# Patient Record
Sex: Female | Born: 2000 | Race: White | Hispanic: No | Marital: Single | Smoking: Never smoker
Health system: Southern US, Community
[De-identification: ages and names within clinical notes are randomized; demographics above are authoritative.]

## PROBLEM LIST (undated history)

## (undated) DIAGNOSIS — J302 Other seasonal allergic rhinitis: Secondary | ICD-10-CM

## (undated) HISTORY — PX: NO PAST SURGERIES: SHX2092

## (undated) HISTORY — DX: Other seasonal allergic rhinitis: J30.2

---
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
## 2019-02-17 ENCOUNTER — Other Ambulatory Visit: Payer: Self-pay

## 2019-02-17 ENCOUNTER — Encounter: Payer: Self-pay | Admitting: Internal Medicine

## 2019-02-17 ENCOUNTER — Telehealth (INDEPENDENT_AMBULATORY_CARE_PROVIDER_SITE_OTHER): Payer: BC Managed Care – PPO | Admitting: Internal Medicine

## 2019-02-17 DIAGNOSIS — M549 Dorsalgia, unspecified: Secondary | ICD-10-CM | POA: Diagnosis not present

## 2019-02-17 DIAGNOSIS — G8929 Other chronic pain: Secondary | ICD-10-CM

## 2019-02-17 NOTE — Progress Notes (Signed)
Virtual Visit via Video Note  I connected with@ on 02/17/19 at  2:00 PM EST by a video enabled telemedicine application and verified that I am speaking with the correct person using two identifiers. Location patient: home Location provider: home office Persons participating in the virtual visit: patient, provider  [place] national recommendations  regarding COVID 19 pandemic   video visit is advised over in office visit for this patient.  Patient aware  of the limitations of evaluation and management by telemedicine and  availability of in person appointments. and agreed to proceed.   HPI: Morgan Snyder presents for video visit   She has had problems with low  back pain and side pain for quite a while over a year but over the last 6 months has been more problematic.  No UTI symptoms but would be checked for that.  She states it bothers her at night sometimes has to put a pillow around her back support to relieve no radiation no weakness no bowel or bladder loss. Denies any specific injury but this has been going ongoing and her mother was concerned.  Has never had an x-ray of her back. No fevers abdominal pain States she is on OCPs.  Due this week.  College Student   On break   ROS: See pertinent positives and negatives per HPI. No fevers hematuria  Past Medical History:  Diagnosis Date  . Seasonal allergies     Past Surgical History:  Procedure Laterality Date  . NO PAST SURGERIES      Family History  Problem Relation Age of Onset  . Anemia Mother   . Hypertension Unknown   . Deafness Unknown       Current Outpatient Medications:  .  erythromycin ophthalmic ointment, Place 1 application into the left eye at bedtime. For 5-7 days., Disp: 3.5 g, Rfl: 0 .  levonorgestrel-ethinyl estradiol (PORTIA-28) 0.15-30 MG-MCG tablet, Take 1 tablet by mouth daily., Disp: 84 tablet, Rfl: 3  EXAM: BP Readings from Last 3 Encounters:  04/04/18 106/68  03/13/18 102/60  11/05/17  99/60    VITALS per patient if applicable:  GENERAL: alert, oriented, appears well and in no acute distress  HEENT: atraumatic, conjunttiva clear, no obvious abnormalities on inspection of external nose and ears  NECK: normal movements of the head and neck  LUNGS: on inspection no signs of respiratory distress, breathing rate appears normal, no obvious gross SOB, gasping or wheezing  CV: no obvious cyanosis  MS: moves all visible extremities without noticeable abnormality  PSYCH/NEURO: pleasant and cooperative, no obvious depression or anxiety, speech and thought processing grossly intact   ASSESSMENT AND PLAN:  Discussed the following assessment and plan:    ICD-10-CM   1. Chronic back pain greater than 3 months duration  M54.9 Urinalysis, Routine w reflex microscopic   G89.29 DG Lumbar Spine Complete    CBC with Differential    CMP    Sed Rate (ESR)    C-reactive protein    DG Si Joints    Pregnancy, urine   Ongoing back pain that is worse in a young person without obvious cause.  Consider inflammatory other mechanical causes. Check lab work to include inflammatory markers x-ray for back and SI joints urine screen and pregnancy Depending on results we will make next step physical therapy and/or sports medicine or back specialist.  She has used turmeric in the past with some slight improvement.  Counseled.   Expectant management and discussion of plan and treatment  with opportunity to ask questions and all were answered. The patient agreed with the plan and demonstrated an understanding of the instructions.   Advised to call back or seek an in-person evaluation if worsening  or having  further concerns . Return if symptoms worsen or fail to improve, for depending on results of evaluation.   Shanon Ace, MD

## 2019-02-19 ENCOUNTER — Other Ambulatory Visit: Payer: Self-pay

## 2019-02-19 ENCOUNTER — Ambulatory Visit (INDEPENDENT_AMBULATORY_CARE_PROVIDER_SITE_OTHER)
Admission: RE | Admit: 2019-02-19 | Discharge: 2019-02-19 | Disposition: A | Discharge status: Home / Self Care | Payer: BC Managed Care – PPO | Source: Ambulatory Visit | Attending: Internal Medicine | Admitting: Internal Medicine

## 2019-02-19 DIAGNOSIS — M533 Sacrococcygeal disorders, not elsewhere classified: Secondary | ICD-10-CM | POA: Diagnosis not present

## 2019-02-19 DIAGNOSIS — G8929 Other chronic pain: Secondary | ICD-10-CM | POA: Diagnosis not present

## 2019-02-19 DIAGNOSIS — M549 Dorsalgia, unspecified: Secondary | ICD-10-CM | POA: Diagnosis not present

## 2019-02-19 DIAGNOSIS — M545 Low back pain: Secondary | ICD-10-CM | POA: Diagnosis not present

## 2019-02-24 NOTE — Progress Notes (Signed)
No acute findings on x ray of back or SI joints  to explain back pain . She did not get the blood work?   Please advise can she still get this done?  See orders  for ? [facility]?

## 2019-02-24 NOTE — Progress Notes (Signed)
No acute findings on x ray of back or SI joints  to explain back pain . She did not get the blood work?   Please advise can she still get this done?  See orders  for ? [facility]?

## 2019-06-17 ENCOUNTER — Encounter: Payer: Self-pay | Admitting: Internal Medicine

## 2019-06-17 ENCOUNTER — Other Ambulatory Visit: Payer: Self-pay

## 2019-06-17 ENCOUNTER — Other Ambulatory Visit (INDEPENDENT_AMBULATORY_CARE_PROVIDER_SITE_OTHER): Payer: Self-pay

## 2019-06-17 ENCOUNTER — Telehealth (INDEPENDENT_AMBULATORY_CARE_PROVIDER_SITE_OTHER): Payer: Self-pay | Admitting: Internal Medicine

## 2019-06-17 VITALS — Ht 64.0 in | Wt 120.0 lb

## 2019-06-17 DIAGNOSIS — R3989 Other symptoms and signs involving the genitourinary system: Secondary | ICD-10-CM

## 2019-06-17 DIAGNOSIS — N926 Irregular menstruation, unspecified: Secondary | ICD-10-CM

## 2019-06-17 DIAGNOSIS — Z3041 Encounter for surveillance of contraceptive pills: Secondary | ICD-10-CM

## 2019-06-17 DIAGNOSIS — G8929 Other chronic pain: Secondary | ICD-10-CM

## 2019-06-17 LAB — URINALYSIS, ROUTINE W REFLEX MICROSCOPIC
Bilirubin Urine: NEGATIVE
Ketones, ur: NEGATIVE
Nitrite: NEGATIVE
Specific Gravity, Urine: 1.01 (ref 1.000–1.030)
Total Protein, Urine: NEGATIVE
Urine Glucose: NEGATIVE
Urobilinogen, UA: 0.2 (ref 0.0–1.0)
pH: 6 (ref 5.0–8.0)

## 2019-06-17 MED ORDER — SULFAMETHOXAZOLE-TRIMETHOPRIM 800-160 MG PO TABS: 1.0000 | ORAL_TABLET | Freq: Two times a day (BID) | ORAL | 0 refills | Status: DC

## 2019-06-17 NOTE — Progress Notes (Signed)
Virtual Visit via Video Note  I connected with@ on 06/17/19 at  2:00 PM EDT by a video enabled telemedicine application and verified that I am speaking with the correct person using two identifiers. Location patient: home Location provider:work  office Persons participating in the virtual visit: patient, provider  [place] national recommendations  regarding COVID 19 pandemic   video visit is advised over in office visit for this patient.  Patient aware  of the limitations of evaluation and management by telemedicine and  availability of in person appointments. and agreed to proceed.   HPI: Morgan Snyder presents for video visit onset last pm some  stinging on urination and then frequency    Then hematuria    And frequency   Took ibuprofen  No abd pain per se v d rash On new ocps since jan had some irreg bleeding but no ewd bleeding last week  Did  UPT and was negative so continues   1 partner no dc or sti sx   Over she says feels like uti a bit better today but  On going.   ROS: See pertinent positives and negatives per HPI.  Past Medical History:  Diagnosis Date  . Seasonal allergies     Past Surgical History:  Procedure Laterality Date  . NO PAST SURGERIES      Family History  Problem Relation Age of Onset  . Anemia Mother   . Hypertension Other   . Deafness Other     Social History   Tobacco Use  . Smoking status: Passive Smoke Exposure - Never Smoker  . Smokeless tobacco: Never Used  Substance Use Topics  . Alcohol use: Yes    Alcohol/week: 1.0 standard drinks    Types: 1 Glasses of wine per week    Comment: Occassionally  . Drug use: Not Currently    Types: Marijuana      Current Outpatient Medications:  .  AUROVELA FE 1/20 1-20 MG-MCG tablet, Take 1 tablet by mouth daily., Disp: , Rfl:  .  Cyanocobalamin (B-12) 2500 MCG SUBL, Place 1 tablet under the tongue daily., Disp: , Rfl:  .  ferrous sulfate 325 (65 FE) MG tablet, Take 325 mg by mouth  daily with breakfast., Disp: , Rfl:  .  Multiple Vitamin (MULTIVITAMIN ADULT PO), multivitamin, Disp: , Rfl:  .  TURMERIC PO, Take 1 tablet by mouth daily., Disp: , Rfl:  .  erythromycin ophthalmic ointment, Place 1 application into the left eye at bedtime. For 5-7 days. (Patient not taking: Reported on 06/17/2019), Disp: 3.5 g, Rfl: 0 .  levonorgestrel-ethinyl estradiol (PORTIA-28) 0.15-30 MG-MCG tablet, Take 1 tablet by mouth daily. (Patient not taking: Reported on 06/17/2019), Disp: 84 tablet, Rfl: 3 .  sulfamethoxazole-trimethoprim (BACTRIM DS) 800-160 MG tablet, Take 1 tablet by mouth 2 (two) times daily. For uti, Disp: 10 tablet, Rfl: 0  EXAM: BP Readings from Last 3 Encounters:  04/04/18 106/68  03/13/18 102/60  11/05/17 99/60    VITALS per patient if applicable:  GENERAL: alert, oriented, appears well and in no acute distress  HEENT: atraumatic, conjunttiva clear, no obvious abnormalities on inspection of external nose and ears NECK: normal movements of the head and neck  LUNGS: on inspection no signs of respiratory distress, breathing rate appears normal, no obvious gross SOB, gasping or wheezing CV: no obvious cyanosis MS: moves all visible extremities without noticeable abnormality PSYCH/NEURO: pleasant and cooperative, no obvious depression or anxiety, speech and thought processing grossly intact Lab Results  Component Value Date   WBC 6.5 09/01/2015   HGB 12.8 11/05/2017   HCT 37.3 09/01/2015   PLT 302.0 09/01/2015   GLUCOSE 86 11/25/2014   CHOL 115 11/25/2014   TRIG 69.0 11/25/2014   HDL 47.10 11/25/2014   LDLCALC 54 11/25/2014   ALT 11 11/25/2014   AST 14 11/25/2014   NA 142 11/25/2014   K 4.2 11/25/2014   CL 106 11/25/2014   CREATININE 0.63 11/25/2014   BUN 7 11/25/2014   CO2 28 11/25/2014   TSH 1.21 11/25/2014    ASSESSMENT AND PLAN:  Discussed the following assessment and plan:    ICD-10-CM   1. Suspected UTI  R39.89 Urinalysis, Routine w reflex  microscopic    Urine Culture    Pregnancy, urine    CANCELED: POCT urine pregnancy  2. Missed period  N92.6 Urinalysis, Routine w reflex microscopic    Urine Culture    Pregnancy, urine    CANCELED: POCT urine pregnancy  3. Oral contraceptive use  Z30.41    Suspected uti   sti less likely based on sx complex  Get ua and ucx and send in medication  Presuming uti  Suspect  Lack of wd bleeding from  Low dose ocps but will check  ucg  Again her HPT was neg .   Counseled.   Expectant management and discussion of plan and treatment with opportunity to ask questions and all were answered. The patient agreed with the plan and demonstrated an understanding of the instructions.   Advised to call back or seek an in-person evaluation if worsening  or having  further concerns .  No follow-ups on file.    Shanon Ace, MD

## 2019-06-17 NOTE — Progress Notes (Signed)
Urine prelim looks mildly abnormal  awaiting culture. Take antibiotic See if you can help her get on  a My chart account.

## 2019-06-19 LAB — URINE CULTURE
MICRO NUMBER:: 10410715
SPECIMEN QUALITY:: ADEQUATE

## 2019-06-19 LAB — PREGNANCY, URINE: Preg Test, Ur: NEGATIVE

## 2019-06-20 NOTE — Progress Notes (Signed)
Tell patient that urine culture shows e coli  typical uti bacteria  sensitive to medication given . Should resolve with current treatment .FU if not better.

## 2019-11-28 ENCOUNTER — Ambulatory Visit: Payer: Self-pay | Admitting: Internal Medicine

## 2019-11-28 ENCOUNTER — Ambulatory Visit: Payer: Self-pay | Admitting: Family Medicine

## 2019-11-28 NOTE — Progress Notes (Deleted)
No chief complaint on file.   HPI: Morgan Snyder 19 y.o. come in for new problem  Saw her GYNE 2 21   Had e coli uti in APril   Gyn is  ROS: See pertinent positives and negatives per HPI.  Past Medical History:  Diagnosis Date  . Seasonal allergies     Family History  Problem Relation Age of Onset  . Anemia Mother   . Hypertension Other   . Deafness Other     Social History   Socioeconomic History  . Marital status: Single    Spouse name: Not on file  . Number of children: Not on file  . Years of education: Not on file  . Highest education level: Not on file  Occupational History  . Occupation: Consulting civil engineer  Tobacco Use  . Smoking status: Passive Smoke Exposure - Never Smoker  . Smokeless tobacco: Never Used  Vaping Use  . Vaping Use: Some days  Substance and Sexual Activity  . Alcohol use: Yes    Alcohol/week: 1.0 standard drink    Types: 1 Glasses of wine per week    Comment: Occassionally  . Drug use: Not Currently    Types: Marijuana  . Sexual activity: Yes    Birth control/protection: Pill  Other Topics Concern  . Not on file  Social History Narrative   In 10 grade at [employer]   Lives with mom Morgan Snyder and step dad    Mom office manager father construction   Volleyball   Household 3 negative pets firearms positive ETS stepfather.    Social Determinants of Health   Financial Resource Strain:   . Difficulty of Paying Living Expenses: Not on file  Food Insecurity:   . Worried About Programme researcher, broadcasting/film/video in the Last Year: Not on file  . Ran Out of Food in the Last Year: Not on file  Transportation Needs:   . Lack of Transportation (Medical): Not on file  . Lack of Transportation (Non-Medical): Not on file  Physical Activity:   . Days of Exercise per Week: Not on file  . Minutes of Exercise per Session: Not on file  Stress:   . Feeling of Stress : Not on file  Social Connections:   . Frequency of Communication with Friends and  Family: Not on file  . Frequency of Social Gatherings with Friends and Family: Not on file  . Attends Religious Services: Not on file  . Active Member of Clubs or Organizations: Not on file  . Attends Banker Meetings: Not on file  . Marital Status: Not on file    Outpatient Medications Prior to Visit  Medication Sig Dispense Refill  . AUROVELA FE 1/20 1-20 MG-MCG tablet Take 1 tablet by mouth daily.    . Cyanocobalamin (B-12) 2500 MCG SUBL Place 1 tablet under the tongue daily.    Marland Kitchen erythromycin ophthalmic ointment Place 1 application into the left eye at bedtime. For 5-7 days. (Patient not taking: Reported on 06/17/2019) 3.5 g 0  . ferrous sulfate 325 (65 FE) MG tablet Take 325 mg by mouth daily with breakfast.    . levonorgestrel-ethinyl estradiol (PORTIA-28) 0.15-30 MG-MCG tablet Take 1 tablet by mouth daily. (Patient not taking: Reported on 06/17/2019) 84 tablet 3  . Multiple Vitamin (MULTIVITAMIN ADULT PO) multivitamin    . sulfamethoxazole-trimethoprim (BACTRIM DS) 800-160 MG tablet Take 1 tablet by mouth 2 (two) times daily. For uti 10 tablet 0  . TURMERIC PO Take  1 tablet by mouth daily.     No facility-administered medications prior to visit.     EXAM:  There were no vitals taken for this visit.  There is no height or weight on file to calculate BMI.  GENERAL: vitals reviewed and listed above, alert, oriented, appears well hydrated and in no acute distress HEENT: atraumatic, conjunctiva  clear, no obvious abnormalities on inspection of external nose and ears OP : no lesion edema or exudate  NECK: no obvious masses on inspection palpation  LUNGS: clear to auscultation bilaterally, no wheezes, rales or rhonchi, good air movement CV: HRRR, no clubbing cyanosis or  peripheral edema nl cap refill  MS: moves all extremities without noticeable focal  abnormality PSYCH: pleasant and cooperative, no obvious depression or anxiety Lab Results  Component Value Date    WBC 6.5 09/01/2015   HGB 12.8 11/05/2017   HCT 37.3 09/01/2015   PLT 302.0 09/01/2015   GLUCOSE 86 11/25/2014   CHOL 115 11/25/2014   TRIG 69.0 11/25/2014   HDL 47.10 11/25/2014   LDLCALC 54 11/25/2014   ALT 11 11/25/2014   AST 14 11/25/2014   NA 142 11/25/2014   K 4.2 11/25/2014   CL 106 11/25/2014   CREATININE 0.63 11/25/2014   BUN 7 11/25/2014   CO2 28 11/25/2014   TSH 1.21 11/25/2014   BP Readings from Last 3 Encounters:  04/04/18 106/68  03/13/18 102/60  11/05/17 99/60    ASSESSMENT AND PLAN:  Discussed the following assessment and plan:  No diagnosis found.  -Patient advised to return or notify health care team  if  new concerns arise.  There are no Patient Instructions on file for this visit.   Neta Mends. Greenleigh Kauth M.D.

## 2019-12-03 ENCOUNTER — Ambulatory Visit (INDEPENDENT_AMBULATORY_CARE_PROVIDER_SITE_OTHER): Payer: PRIVATE HEALTH INSURANCE | Admitting: Internal Medicine

## 2019-12-03 ENCOUNTER — Encounter: Payer: Self-pay | Admitting: Internal Medicine

## 2019-12-03 ENCOUNTER — Other Ambulatory Visit: Payer: Self-pay

## 2019-12-03 ENCOUNTER — Other Ambulatory Visit (HOSPITAL_COMMUNITY)
Admission: RE | Admit: 2019-12-03 | Discharge: 2019-12-03 | Disposition: A | Discharge status: Home / Self Care | Payer: PRIVATE HEALTH INSURANCE | Source: Ambulatory Visit | Attending: Internal Medicine | Admitting: Internal Medicine

## 2019-12-03 VITALS — BP 90/60 | HR 72 | Temp 98.1°F | Ht 65.0 in | Wt 114.0 lb

## 2019-12-03 DIAGNOSIS — N898 Other specified noninflammatory disorders of vagina: Secondary | ICD-10-CM

## 2019-12-03 DIAGNOSIS — Z3009 Encounter for other general counseling and advice on contraception: Secondary | ICD-10-CM | POA: Diagnosis not present

## 2019-12-03 MED ORDER — FLUCONAZOLE 150 MG PO TABS
150.0000 mg | ORAL_TABLET | Freq: Once | ORAL | 0 refills | Status: AC
Start: 2019-12-03 — End: 2019-12-03

## 2019-12-03 NOTE — Progress Notes (Signed)
Chief Complaint  Patient presents with  . Acute Visit    vaginal itching/ raw spot on vaginal area    HPI: Morgan Snyder 19 y.o. come in for  Problem   Has gyne   On ocps but stopped   Recently  About  3 mos ago. Cause  Of   [place].  Has partner  ocass condoms and spermicide  to start   No rx  Wonder sif yeast irchy  Mild and  sore times sore at  Pos introitus  Reluctant to try otc vaginal  As in past   Had uti April e coli  LMP  October 7  No different.   No self rx.  At this  time  Not sure it is yeast but asks for eval    ROS: See pertinent positives and negatives per HPI.  Past Medical History:  Diagnosis Date  . Seasonal allergies     Family History  Problem Relation Age of Onset  . Anemia Mother   . Hypertension Other   . Deafness Other     Social History   Socioeconomic History  . Marital status: Single    Spouse name: Not on file  . Number of children: Not on file  . Years of education: Not on file  . Highest education level: Not on file  Occupational History  . Occupation: Consulting civil engineertudent  Tobacco Use  . Smoking status: Passive Smoke Exposure - Never Smoker  . Smokeless tobacco: Never Used  Vaping Use  . Vaping Use: Some days  Substance and Sexual Activity  . Alcohol use: Yes    Alcohol/week: 1.0 standard drink    Types: 1 Glasses of wine per week    Comment: Occassionally  . Drug use: Not Currently    Types: Marijuana  . Sexual activity: Yes    Birth control/protection: Pill  Other Topics Concern  . Not on file  Social History Narrative   In 10 grade at [employer]   Lives with mom Sherley Boundslisha Friley and step dad    Mom office manager father construction   Volleyball   Household 3 negative pets firearms positive ETS stepfather.    Social Determinants of Health   Financial Resource Strain:   . Difficulty of Paying Living Expenses: Not on file  Food Insecurity:   . Worried About Programme researcher, broadcasting/film/videounning Out of Food in the Last Year: Not on file  . Ran Out of  Food in the Last Year: Not on file  Transportation Needs:   . Lack of Transportation (Medical): Not on file  . Lack of Transportation (Non-Medical): Not on file  Physical Activity:   . Days of Exercise per Week: Not on file  . Minutes of Exercise per Session: Not on file  Stress:   . Feeling of Stress : Not on file  Social Connections:   . Frequency of Communication with Friends and Family: Not on file  . Frequency of Social Gatherings with Friends and Family: Not on file  . Attends Religious Services: Not on file  . Active Member of Clubs or Organizations: Not on file  . Attends BankerClub or Organization Meetings: Not on file  . Marital Status: Not on file    Outpatient Medications Prior to Visit  Medication Sig Dispense Refill  . AUROVELA FE 1/20 1-20 MG-MCG tablet Take 1 tablet by mouth daily.    . Cyanocobalamin (B-12) 2500 MCG SUBL Place 1 tablet under the tongue daily.    Marland Kitchen. erythromycin ophthalmic  ointment Place 1 application into the left eye at bedtime. For 5-7 days. 3.5 g 0  . ferrous sulfate 325 (65 FE) MG tablet Take 325 mg by mouth daily with breakfast.    . Multiple Vitamin (MULTIVITAMIN ADULT PO) multivitamin    . PHEXXI 1.8-1-0.4 % GEL Place vaginally.    . TURMERIC PO Take 1 tablet by mouth daily.    Marland Kitchen levonorgestrel-ethinyl estradiol (PORTIA-28) 0.15-30 MG-MCG tablet Take 1 tablet by mouth daily. (Patient not taking: Reported on 06/17/2019) 84 tablet 3  . sulfamethoxazole-trimethoprim (BACTRIM DS) 800-160 MG tablet Take 1 tablet by mouth 2 (two) times daily. For uti (Patient not taking: Reported on 12/03/2019) 10 tablet 0   No facility-administered medications prior to visit.     EXAM:  BP 90/60   Pulse 72   Temp 98.1 F (36.7 C) (Oral)   Ht 5\' 5"  (1.651 m)   Wt 114 lb (51.7 kg)   LMP 11/27/2019 (Exact Date)   SpO2 95%   BMI 18.97 kg/m   Body mass index is 18.97 kg/m.  GENERAL: vitals reviewed and listed above, alert, oriented, appears well hydrated and in  no acute distress HEENT: atraumatic, conjunctiva  clear, no obvious abnormalities on inspection of external nose and ears OP : masked  NECK: no obvious masses on inspection palpation   Ext GU  Mild erythema labia but no lesion  And irritated at  Post fourchette but no fissue   cx os clear   Mild white dc no lesions   sample vaginal and  cx  Thin prep for  gc chl yeast trich and bv PSYCH: pleasant and cooperative, no obvious depression or anxiety  BP Readings from Last 3 Encounters:  12/03/19 90/60  04/04/18 106/68  03/13/18 102/60    ASSESSMENT AND PLAN:  Discussed the following assessment and plan:  Vaginal discharge - minimal with extrnal irritation  off ocps  - Plan: Cervicovaginal ancillary only  Encounter for other general counseling or advice on contraception - she is considering iud and i encouraged  this.  see her gyne to discuss  Empiric diflucan and await  Results Sign up for my chart   In fo  On AVS -Patient advised to return or notify health care team  if  new concerns arise.  Patient Instructions  Can treat for yeast.  await results of testing exam looks good except external irritation.  Advise  Get  With GYNE and discuss contraception suchas   IUD  .    Vaginitis Vaginitis is a condition in which the vaginal tissue swells and becomes red (inflamed). This condition is most often caused by a change in the normal balance of bacteria and yeast that live in the vagina. This change causes an overgrowth of certain bacteria or yeast, which causes the inflammation. There are different types of vaginitis, but the most common types are:  Bacterial vaginosis.  Yeast infection (candidiasis).  Trichomoniasis vaginitis. This is a sexually transmitted disease (STD).  Viral vaginitis.  Atrophic vaginitis.  Allergic vaginitis. What are the causes? The cause of this condition depends on the type of vaginitis. It can be caused by:  Bacteria (bacterial vaginosis).  Yeast,  which is a fungus (yeast infection).  A parasite (trichomoniasis vaginitis).  A virus (viral vaginitis).  Low hormone levels (atrophic vaginitis). Low hormone levels can occur during pregnancy, breastfeeding, or after menopause.  Irritants, such as bubble baths, scented tampons, and feminine sprays (allergic vaginitis). Other factors can change the normal balance  of the yeast and bacteria that live in the vagina. These include:  Antibiotic medicines.  Poor hygiene.  Diaphragms, vaginal sponges, spermicides, birth control pills, and intrauterine devices (IUD).  Sex.  Infection.  Uncontrolled diabetes.  A weakened defense (immune) system. What increases the risk? This condition is more likely to develop in women who:  Smoke.  Use vaginal douches, scented tampons, or scented sanitary pads.  Wear tight-fitting pants.  Wear thong underwear.  Use oral birth control pills or an IUD.  Have sex without a condom.  Have multiple sex partners.  Have an STD.  Frequently use the spermicide nonoxynol-9.  Eat lots of foods high in sugar.  Have uncontrolled diabetes.  Have low estrogen levels.  Have a weakened immune system from an immune disorder or medical treatment.  Are pregnant or breastfeeding. What are the signs or symptoms? Symptoms vary depending on the cause of the vaginitis. Common symptoms include:  Abnormal vaginal discharge. ? The discharge is white, gray, or yellow with bacterial vaginosis. ? The discharge is thick, white, and cheesy with a yeast infection. ? The discharge is frothy and yellow or greenish with trichomoniasis.  A bad vaginal smell. The smell is fishy with bacterial vaginosis.  Vaginal itching, pain, or swelling.  Sex that is painful.  Pain or burning when urinating. Sometimes there are no symptoms. How is this diagnosed? This condition is diagnosed based on your symptoms and medical history. A physical exam, including a pelvic exam,  will also be done. You may also have other tests, including:  Tests to determine the pH level (acidity or alkalinity) of your vagina.  A whiff test, to assess the odor that results when a sample of your vaginal discharge is mixed with a potassium hydroxide solution.  Tests of vaginal fluid. A sample will be examined under a microscope. How is this treated? Treatment varies depending on the type of vaginitis you have. Your treatment may include:  Antibiotic creams or pills to treat bacterial vaginosis and trichomoniasis.  Antifungal medicines, such as vaginal creams or suppositories, to treat a yeast infection.  Medicine to ease discomfort if you have viral vaginitis. Your sexual partner should also be treated.  Estrogen delivered in a cream, pill, suppository, or vaginal ring to treat atrophic vaginitis. If vaginal dryness occurs, lubricants and moisturizing creams may help. You may need to avoid scented soaps, sprays, or douches.  Stopping use of a product that is causing allergic vaginitis. Then using a vaginal cream to treat the symptoms. Follow these instructions at home: Lifestyle  Keep your genital area clean and dry. Avoid soap, and only rinse the area with water.  Do not douche or use tampons until your health care provider says it is okay to do so. Use sanitary pads, if needed.  Do not have sex until your health care provider approves. When you can return to sex, practice safe sex and use condoms.  Wipe from front to back. This avoids the spread of bacteria from the rectum to the vagina. General instructions  Take over-the-counter and prescription medicines only as told by your health care provider.  If you were prescribed an antibiotic medicine, take or use it as told by your health care provider. Do not stop taking or using the antibiotic even if you start to feel better.  Keep all follow-up visits as told by your health care provider. This is important. How is this  prevented?  Use mild, non-scented products. Do not use things that can  irritate the vagina, such as fabric softeners. Avoid the following products if they are scented: ? Feminine sprays. ? Detergents. ? Tampons. ? Feminine hygiene products. ? Soaps or bubble baths.  Let air reach your genital area. ? Wear cotton underwear to reduce moisture buildup. ? Avoid wearing underwear while you sleep. ? Avoid wearing tight pants and underwear or nylons without a cotton panel. ? Avoid wearing thong underwear.  Take off any wet clothing, such as bathing suits, as soon as possible.  Practice safe sex and use condoms. Contact a health care provider if:  You have abdominal pain.  You have a fever.  You have symptoms that last for more than 2-3 days. Get help right away if:  You have a fever and your symptoms suddenly get worse. Summary  Vaginitis is a condition in which the vaginal tissue becomes inflamed.This condition is most often caused by a change in the normal balance of bacteria and yeast that live in the vagina.  Treatment varies depending on the type of vaginitis you have.  Do not douche, use tampons , or have sex until your health care provider approves. When you can return to sex, practice safe sex and use condoms. This information is not intended to replace advice given to you by your health care provider. Make sure you discuss any questions you have with your health care provider. Document Revised: 01/19/2017 Document Reviewed: 03/14/2016 Elsevier Patient Education  2020 [employer].       [place] K. Brinden Kincheloe M.D.

## 2019-12-03 NOTE — Patient Instructions (Signed)
Can treat for yeast.  await results of testing exam looks good except external irritation.  Advise  Get  With GYNE and discuss contraception suchas   IUD  .    Vaginitis Vaginitis is a condition in which the vaginal tissue swells and becomes red (inflamed). This condition is most often caused by a change in the normal balance of bacteria and yeast that live in the vagina. This change causes an overgrowth of certain bacteria or yeast, which causes the inflammation. There are different types of vaginitis, but the most common types are:  Bacterial vaginosis.  Yeast infection (candidiasis).  Trichomoniasis vaginitis. This is a sexually transmitted disease (STD).  Viral vaginitis.  Atrophic vaginitis.  Allergic vaginitis. What are the causes? The cause of this condition depends on the type of vaginitis. It can be caused by:  Bacteria (bacterial vaginosis).  Yeast, which is a fungus (yeast infection).  A parasite (trichomoniasis vaginitis).  A virus (viral vaginitis).  Low hormone levels (atrophic vaginitis). Low hormone levels can occur during pregnancy, breastfeeding, or after menopause.  Irritants, such as bubble baths, scented tampons, and feminine sprays (allergic vaginitis). Other factors can change the normal balance of the yeast and bacteria that live in the vagina. These include:  Antibiotic medicines.  Poor hygiene.  Diaphragms, vaginal sponges, spermicides, birth control pills, and intrauterine devices (IUD).  Sex.  Infection.  Uncontrolled diabetes.  A weakened defense (immune) system. What increases the risk? This condition is more likely to develop in women who:  Smoke.  Use vaginal douches, scented tampons, or scented sanitary pads.  Wear tight-fitting pants.  Wear thong underwear.  Use oral birth control pills or an IUD.  Have sex without a condom.  Have multiple sex partners.  Have an STD.  Frequently use the spermicide  nonoxynol-9.  Eat lots of foods high in sugar.  Have uncontrolled diabetes.  Have low estrogen levels.  Have a weakened immune system from an immune disorder or medical treatment.  Are pregnant or breastfeeding. What are the signs or symptoms? Symptoms vary depending on the cause of the vaginitis. Common symptoms include:  Abnormal vaginal discharge. ? The discharge is white, gray, or yellow with bacterial vaginosis. ? The discharge is thick, white, and cheesy with a yeast infection. ? The discharge is frothy and yellow or greenish with trichomoniasis.  A bad vaginal smell. The smell is fishy with bacterial vaginosis.  Vaginal itching, pain, or swelling.  Sex that is painful.  Pain or burning when urinating. Sometimes there are no symptoms. How is this diagnosed? This condition is diagnosed based on your symptoms and medical history. A physical exam, including a pelvic exam, will also be done. You may also have other tests, including:  Tests to determine the pH level (acidity or alkalinity) of your vagina.  A whiff test, to assess the odor that results when a sample of your vaginal discharge is mixed with a potassium hydroxide solution.  Tests of vaginal fluid. A sample will be examined under a microscope. How is this treated? Treatment varies depending on the type of vaginitis you have. Your treatment may include:  Antibiotic creams or pills to treat bacterial vaginosis and trichomoniasis.  Antifungal medicines, such as vaginal creams or suppositories, to treat a yeast infection.  Medicine to ease discomfort if you have viral vaginitis. Your sexual partner should also be treated.  Estrogen delivered in a cream, pill, suppository, or vaginal ring to treat atrophic vaginitis. If vaginal dryness occurs, lubricants and moisturizing  creams may help. You may need to avoid scented soaps, sprays, or douches.  Stopping use of a product that is causing allergic vaginitis. Then  using a vaginal cream to treat the symptoms. Follow these instructions at home: Lifestyle  Keep your genital area clean and dry. Avoid soap, and only rinse the area with water.  Do not douche or use tampons until your health care provider says it is okay to do so. Use sanitary pads, if needed.  Do not have sex until your health care provider approves. When you can return to sex, practice safe sex and use condoms.  Wipe from front to back. This avoids the spread of bacteria from the rectum to the vagina. General instructions  Take over-the-counter and prescription medicines only as told by your health care provider.  If you were prescribed an antibiotic medicine, take or use it as told by your health care provider. Do not stop taking or using the antibiotic even if you start to feel better.  Keep all follow-up visits as told by your health care provider. This is important. How is this prevented?  Use mild, non-scented products. Do not use things that can irritate the vagina, such as fabric softeners. Avoid the following products if they are scented: ? Feminine sprays. ? Detergents. ? Tampons. ? Feminine hygiene products. ? Soaps or bubble baths.  Let air reach your genital area. ? Wear cotton underwear to reduce moisture buildup. ? Avoid wearing underwear while you sleep. ? Avoid wearing tight pants and underwear or nylons without a cotton panel. ? Avoid wearing thong underwear.  Take off any wet clothing, such as bathing suits, as soon as possible.  Practice safe sex and use condoms. Contact a health care provider if:  You have abdominal pain.  You have a fever.  You have symptoms that last for more than 2-3 days. Get help right away if:  You have a fever and your symptoms suddenly get worse. Summary  Vaginitis is a condition in which the vaginal tissue becomes inflamed.This condition is most often caused by a change in the normal balance of bacteria and yeast that  live in the vagina.  Treatment varies depending on the type of vaginitis you have.  Do not douche, use tampons , or have sex until your health care provider approves. When you can return to sex, practice safe sex and use condoms. This information is not intended to replace advice given to you by your health care provider. Make sure you discuss any questions you have with your health care provider. Document Revised: 01/19/2017 Document Reviewed: 03/14/2016 Elsevier Patient Education  2020 [employer].

## 2019-12-04 ENCOUNTER — Telehealth: Payer: Self-pay | Admitting: Internal Medicine

## 2019-12-04 NOTE — Telephone Encounter (Signed)
[name] at [facility] called me stating that although she received the Thin Prep Pap and the orders they are unable to complete the Gardnerella and Candida with this/states that these two are not something that can any longer be resulted from Pap test and an additional swab for this would have to be sent/they will do all other testing requested though/thx dmf

## 2019-12-05 LAB — CERVICOVAGINAL ANCILLARY ONLY
Chlamydia: NEGATIVE
Comment: NEGATIVE
Comment: NEGATIVE
Comment: NORMAL
Neisseria Gonorrhea: NEGATIVE
Trichomonas: NEGATIVE

## 2019-12-09 NOTE — Progress Notes (Signed)
Tests negative for gc chlamydia trich  unfortunately they were not able to complete test for yeast  ( that we were looking for)      So since we treated  cant confirm but ruled out other causes .   How are you doing since taking the diflucan ?  We can have you see gyne anyway   if ongoing problem   and  poss IUD

## 2020-04-30 DIAGNOSIS — Z3040 Encounter for surveillance of contraceptives, unspecified: Secondary | ICD-10-CM | POA: Diagnosis not present

## 2020-04-30 DIAGNOSIS — Z681 Body mass index (BMI) 19 or less, adult: Secondary | ICD-10-CM | POA: Diagnosis not present

## 2020-04-30 DIAGNOSIS — Z01419 Encounter for gynecological examination (general) (routine) without abnormal findings: Secondary | ICD-10-CM | POA: Diagnosis not present

## 2020-07-22 ENCOUNTER — Encounter: Payer: Self-pay | Admitting: Family Medicine

## 2020-07-22 ENCOUNTER — Other Ambulatory Visit: Payer: Self-pay

## 2020-07-22 ENCOUNTER — Other Ambulatory Visit (HOSPITAL_COMMUNITY)
Admission: RE | Admit: 2020-07-22 | Discharge: 2020-07-22 | Disposition: A | Discharge status: Home / Self Care | Payer: 59 | Source: Ambulatory Visit | Attending: Family Medicine | Admitting: Family Medicine

## 2020-07-22 ENCOUNTER — Ambulatory Visit: Payer: 59 | Admitting: Family Medicine

## 2020-07-22 VITALS — BP 118/60 | HR 84 | Temp 97.8°F | Wt 123.2 lb

## 2020-07-22 DIAGNOSIS — N760 Acute vaginitis: Secondary | ICD-10-CM | POA: Insufficient documentation

## 2020-07-22 DIAGNOSIS — R109 Unspecified abdominal pain: Secondary | ICD-10-CM | POA: Insufficient documentation

## 2020-07-22 DIAGNOSIS — R3989 Other symptoms and signs involving the genitourinary system: Secondary | ICD-10-CM

## 2020-07-22 DIAGNOSIS — Z113 Encounter for screening for infections with a predominantly sexual mode of transmission: Secondary | ICD-10-CM | POA: Insufficient documentation

## 2020-07-22 LAB — POCT URINALYSIS DIPSTICK
Bilirubin, UA: NEGATIVE
Blood, UA: NEGATIVE
Glucose, UA: NEGATIVE
Ketones, UA: NEGATIVE
Leukocytes, UA: NEGATIVE
Nitrite, UA: NEGATIVE
Protein, UA: NEGATIVE
Spec Grav, UA: 1.01 (ref 1.010–1.025)
Urobilinogen, UA: NEGATIVE E.U./dL — AB
pH, UA: 7 (ref 5.0–8.0)

## 2020-07-22 NOTE — Patient Instructions (Signed)
Vaginitis  Vaginitis is irritation and swelling of the vagina. Treatment will depend on the cause. What are the causes? It can be caused by:  Bacteria.  Yeast.  A parasite.  A virus.  Low hormone levels.  Bubble baths, scented tampons, and feminine sprays. Other things can change the balance of the yeast and bacteria that live in the vagina. These include:  Antibiotic medicines.  Not being clean enough.  Some birth control methods.  Sex.  Infection.  Diabetes.  A weakened body defense system (immune system). What increases the risk?  Smoking or being around someone who smokes.  Using washes (douches), scented tampons, or scented pads.  Wearing tight pants or thong underwear.  Using birth control pills or an IUD.  Having sex without a condom or having a lot of partners.  Having an STI.  Using a certain product to kill sperm (nonoxynol-9).  Eating foods that are high in sugar.  Having diabetes.  Having low levels of a female hormone.  Having a weakened body defense system.  Being pregnant or breastfeeding. What are the signs or symptoms?  Fluid coming from the vagina that is not normal.  A bad smell.  Itching, pain, or swelling.  Pain with sex.  Pain or burning when you pee (urinate). Sometimes there are no symptoms. How is this treated? Treatment may include:  Antibiotic creams or pills.  Antifungal medicines.  Medicines to ease symptoms if you have a virus. Your sex partner should also be treated.  Estrogen medicines.  Avoiding scented soaps, sprays, or douches.  Stopping use of products that caused irritation and then using a cream to treat symptoms. Follow these instructions at home: Lifestyle  Keep the area around your vagina clean and dry. ? Avoid using soap. ? Rinse the area with water.  Until your doctor says it is okay: ? Do not use washes for the vagina. ? Do not use tampons. ? Do not have sex.  Wipe from front to  back after going to the bathroom.  When your doctor says it is okay, practice safe sex and use condoms. General instructions  Take over-the-counter and prescription medicines only as told by your doctor.  If you were prescribed an antibiotic medicine, take or use it as told by your doctor. Do not stop taking or using it even if you start to feel better.  Keep all follow-up visits. How is this prevented?  Do not use things that can irritate the vagina, such as fabric softeners. Avoid these products if they are scented: ? Sprays. ? Detergents. ? Tampons. ? Products for cleaning the vagina. ? Soaps or bubble baths.  Let air reach your vagina. To do this: ? Wear cotton underwear. ? Do not wear:  Underwear while you sleep.  Tight pants.  Thong underwear.  Underwear or nylons without a cotton panel. ? Take off any wet clothing, such as bathing suits, as soon as you can. ? Practice safe sex and use condoms. Contact a doctor if:  You have pain in your belly or in the area between your hips.  You have a fever or chills.  Your symptoms last for more than 2-3 days. Get help right away if:  You have a fever and your symptoms get worse all of a sudden. Summary  Vaginitis is irritation and swelling of the vagina.  Treatment will depend on the cause of the condition.  Do not use washes or tampons or have sex until your doctor says it   is okay. This information is not intended to replace advice given to you by your health care provider. Make sure you discuss any questions you have with your health care provider. Document Revised: 08/07/2019 Document Reviewed: 08/07/2019 Elsevier Patient Education  2021 Elsevier Inc.  

## 2020-07-22 NOTE — Progress Notes (Signed)
Subjective:    Patient ID: Morgan Snyder, female    DOB: [birth year], 20 y.o.   MRN: [id number]  Chief Complaint  Patient presents with  . Urinary Frequency    Frequent, painfl urination and low abdominal pain for 2 days.    HPI Patient was seen today for acute concern.  Patient endorses dysuria, frequency, lower abdominal pain and vaginal discharge/irritation x 2 days.  Patient increased p.o. intake of fluids including water and cranberry juice.  Patient notes recent unprotected intercourse. LMP 07/02/2020.  Patient endorses some nausea a few days ago.  Denies changes in soaps, lotions, detergents, constipation, fever, chills.  Using [place].  Tried BC powder for abdominal pain.  Past Medical History:  Diagnosis Date  . Seasonal allergies     No Known Allergies  ROS General: Denies fever, chills, night sweats, changes in weight, changes in appetite HEENT: Denies headaches, ear pain, changes in vision, rhinorrhea, sore throat CV: Denies CP, palpitations, SOB, orthopnea Pulm: Denies SOB, cough, wheezing GI: Denies abdominal pain, nausea, vomiting, diarrhea, constipation + suprapubic pain GU: Denies hematuria + frequency, vaginal discharge, dysuria Msk: Denies muscle cramps, joint pains Neuro: Denies weakness, numbness, tingling Skin: Denies rashes, bruising Psych: Denies depression, anxiety, hallucinations    Objective:    Blood pressure 118/60, pulse 84, temperature 97.8 F (36.6 C), temperature source Oral, weight 123 lb 3.2 oz (55.9 kg), SpO2 99 %.  Gen. Pleasant, well-nourished, in no distress, normal affect   HEENT: [place]/AT, face symmetric, conjunctiva clear, no scleral icterus, PERRLA, EOMI, nares patent without drainage Lungs: no accessory muscle use Cardiovascular: RRR, no peripheral edema Musculoskeletal: No deformities, no cyanosis or clubbing, normal tone GU: Aptima pt collected swab obtained Neuro:  A&Ox3, CN II-XII intact, normal gait Skin:  Warm, no lesions/  rash  Wt Readings from Last 3 Encounters:  07/22/20 123 lb 3.2 oz (55.9 kg)  12/03/19 114 lb (51.7 kg)  06/17/19 120 lb (54.4 kg)    Lab Results  Component Value Date   WBC 6.5 09/01/2015   HGB 12.8 11/05/2017   HCT 37.3 09/01/2015   PLT 302.0 09/01/2015   GLUCOSE 86 11/25/2014   CHOL 115 11/25/2014   TRIG 69.0 11/25/2014   HDL 47.10 11/25/2014   LDLCALC 54 11/25/2014   ALT 11 11/25/2014   AST 14 11/25/2014   NA 142 11/25/2014   K 4.2 11/25/2014   CL 106 11/25/2014   CREATININE 0.63 11/25/2014   BUN 7 11/25/2014   CO2 28 11/25/2014   TSH 1.21 11/25/2014    Assessment/Plan:  Acute vaginitis  -Self swab obtained.  Will assess for BV, yeast, trichomoniasis, GC -Given handout -Discussed supportive care including using gentle/mild unscented soaps, OTC external vaginal cream -If needed will send in prescription medication based on results - Plan: Cervicovaginal ancillary only  Suspected UTI -UA negative -Symptoms likely 2/2 vaginitis - Plan: POCT urinalysis dipstick  F/u prn  Abbe Amsterdam, MD

## 2020-07-23 DIAGNOSIS — R3989 Other symptoms and signs involving the genitourinary system: Secondary | ICD-10-CM | POA: Diagnosis present

## 2020-07-23 DIAGNOSIS — Z113 Encounter for screening for infections with a predominantly sexual mode of transmission: Secondary | ICD-10-CM | POA: Diagnosis not present

## 2020-07-23 DIAGNOSIS — R109 Unspecified abdominal pain: Secondary | ICD-10-CM | POA: Diagnosis not present

## 2020-07-23 DIAGNOSIS — N760 Acute vaginitis: Secondary | ICD-10-CM | POA: Diagnosis not present

## 2020-07-26 LAB — CERVICOVAGINAL ANCILLARY ONLY
Bacterial Vaginitis (gardnerella): NEGATIVE
Candida Glabrata: NEGATIVE
Candida Vaginitis: NEGATIVE
Chlamydia: NEGATIVE
Comment: NEGATIVE
Comment: NEGATIVE
Comment: NEGATIVE
Comment: NEGATIVE
Comment: NEGATIVE
Comment: NORMAL
Neisseria Gonorrhea: NEGATIVE
Trichomonas: NEGATIVE

## 2020-07-28 NOTE — Progress Notes (Signed)
Spoke with patient, is aware. 

## 2020-07-28 NOTE — Progress Notes (Signed)
Patient is aware 

## 2020-11-08 ENCOUNTER — Encounter: Payer: Self-pay | Admitting: Internal Medicine

## 2020-11-08 ENCOUNTER — Telehealth (INDEPENDENT_AMBULATORY_CARE_PROVIDER_SITE_OTHER): Payer: 59 | Admitting: Internal Medicine

## 2020-11-08 VITALS — Ht 65.0 in | Wt 123.2 lb

## 2020-11-08 DIAGNOSIS — J019 Acute sinusitis, unspecified: Secondary | ICD-10-CM | POA: Diagnosis not present

## 2020-11-08 DIAGNOSIS — J069 Acute upper respiratory infection, unspecified: Secondary | ICD-10-CM | POA: Diagnosis not present

## 2020-11-08 MED ORDER — AMOXICILLIN-POT CLAVULANATE 875-125 MG PO TABS: 1.0000 | ORAL_TABLET | Freq: Two times a day (BID) | ORAL | 0 refills | Status: DC

## 2020-11-08 NOTE — Progress Notes (Signed)
   Virtual Visit via Telephone Note  I connected with@ on 11/08/20 at  4:00 PM EDT by telephone and verified that I am speaking with the correct person using two identifiers.   I discussed the limitations, risks, security and privacy concerns of performing an evaluation and management service by telephone and the limited availability of in person appointments. tThere may be a patient responsible charge related to this service. The patient expressed understanding and agreed to proceed.  Location patient: home Location provider: work office Participants present for the call: patient, provider Patient did not have a visit in the prior 7 days to address this/these issue(s).   History of Present Illness: Morgan Snyder presents for preferred televisit versus virtual visit video because of over 2 weeks of upper respiratory symptoms. Onset with scratchy throat and 1 day of cough and congestion that got better with over-the-counter's the next day developed more congestion continual nasal discharge turning green and over the last day or 2 brown.  No serious sinus pain or fever and minimal cough at all.  Over-the-counter is not helpful tongue looks white. Did not do a COVID test was not sick.  Wonders if she has a sinus infection.  Because her symptoms are not getting better after 2 weeks. No known drug allergies Observations/Objective: Patient sounds personable and well on the phone. I do not appreciate any SOB. Speech and thought processing are grossly intact. Patient reported vitals: No cough during visit.   Assessment and Plan: Acute rhinosinusitis - Prolonged 2 weeks plus not improving see note  Protracted URI   Follow Up Instructions:  @ Advised testing for COVID as some of her symptoms could be consistent with omicron in community and may be important for avoiding exposures. Sinus hygiene add Augmentin x7 days expectant management follow-up if persistent progressive.  As  indicated. Send in a message about the results of her COVID test.  [id number] [id number] [id number] 11-20 [id number] 21-30 I did not refer this patient for an OV in the next 24 hours for this/these issue(s).  I discussed the assessment and treatment plan with the patient. The patient was provided an opportunity to ask questions and answered. The patient agreed with the plan and demonstrated an understanding of the instructions.   The patient was advised to call back or seek an in-person evaluation if the symptoms worsen or if the condition fails to improve as anticipated.  I provided 17 minutes of non-face-to-face time during this encounter. Return if symptoms worsen or fail to improve as expected.  Berniece Andreas, MD

## 2020-11-09 ENCOUNTER — Telehealth: Payer: Self-pay | Admitting: Internal Medicine

## 2020-11-09 MED ORDER — AMOXICILLIN-POT CLAVULANATE 875-125 MG PO TABS: 1.0000 | ORAL_TABLET | Freq: Two times a day (BID) | ORAL | 0 refills | Status: DC

## 2020-11-09 NOTE — Telephone Encounter (Signed)
RX sent and pt is aware. 

## 2020-11-09 NOTE — Telephone Encounter (Signed)
Patient is calling because prescription for amoxicillin-clavulanate (AUGMENTIN) 875-125 MG tablet was sent to the wrong pharmacy. [facility] will not accept patients insurance, [facility]   [facility] [address]   Good callback number is [phone]   Please Advise

## 2020-11-22 ENCOUNTER — Telehealth: Payer: Self-pay | Admitting: Internal Medicine

## 2020-11-22 NOTE — Telephone Encounter (Signed)
Patient called after hours line on 11/19/20 at 2:51 pm.  Caller states she is having vaginal pain.  Caller stated that she was recently sexually active and the condom came off--now has discomfort in the vaginal area.  She states that this happened 2 weeks ago and pain is getting worse.  Caller states she started medication for a sinus infection.

## 2020-11-22 NOTE — Telephone Encounter (Signed)
Unable to leave a message due to voicemail being full. 

## 2020-11-23 NOTE — Telephone Encounter (Signed)
Pt states she had been feeling vaginal pain x 2.5 weeks describing as burning/itching & labia feels swollen on right side.  Endorses large amt of vaginal discharge & odor. Pt states she has been using the external monistat cream with minimal improvement. Of note: has taken a course of Amoxicillin started on 11/09/20 for sinus infection & sinus symptoms have not improved.  Pt requests OV. Aware that PCP out of office, 1st available made with [facility]. Pt declines seeing her GYN for this issue.

## 2020-11-24 ENCOUNTER — Ambulatory Visit (INDEPENDENT_AMBULATORY_CARE_PROVIDER_SITE_OTHER): Payer: 59 | Admitting: Adult Health

## 2020-11-24 ENCOUNTER — Other Ambulatory Visit (HOSPITAL_COMMUNITY)
Admission: RE | Admit: 2020-11-24 | Discharge: 2020-11-24 | Disposition: A | Discharge status: Home / Self Care | Payer: 59 | Source: Ambulatory Visit | Attending: Adult Health | Admitting: Adult Health

## 2020-11-24 ENCOUNTER — Encounter: Payer: Self-pay | Admitting: Adult Health

## 2020-11-24 ENCOUNTER — Other Ambulatory Visit: Payer: Self-pay

## 2020-11-24 VITALS — BP 110/70 | HR 105 | Temp 99.0°F | Ht 65.0 in | Wt 118.0 lb

## 2020-11-24 DIAGNOSIS — B3731 Acute candidiasis of vulva and vagina: Secondary | ICD-10-CM

## 2020-11-24 DIAGNOSIS — B9689 Other specified bacterial agents as the cause of diseases classified elsewhere: Secondary | ICD-10-CM | POA: Diagnosis not present

## 2020-11-24 DIAGNOSIS — N898 Other specified noninflammatory disorders of vagina: Secondary | ICD-10-CM | POA: Insufficient documentation

## 2020-11-24 DIAGNOSIS — J069 Acute upper respiratory infection, unspecified: Secondary | ICD-10-CM | POA: Diagnosis not present

## 2020-11-24 DIAGNOSIS — N760 Acute vaginitis: Secondary | ICD-10-CM

## 2020-11-24 MED ORDER — PREDNISONE 20 MG PO TABS: 20.0000 mg | ORAL_TABLET | Freq: Every day | ORAL | 0 refills | Status: DC

## 2020-11-24 MED ORDER — METRONIDAZOLE 500 MG PO TABS: 500.0000 mg | ORAL_TABLET | Freq: Three times a day (TID) | ORAL | 0 refills | Status: AC

## 2020-11-24 MED ORDER — FLUCONAZOLE 150 MG PO TABS: ORAL_TABLET | ORAL | 0 refills | Status: DC

## 2020-11-24 NOTE — Progress Notes (Signed)
Subjective:    Patient ID: Morgan Snyder, female    DOB: June 15, 2000, 20 y.o.   MRN: [id number]  HPI 20 year old female who  has a past medical history of Seasonal allergies.  She presents to the office today for two separate issues.   She was treated by her PCP roughly 3 weeks ago for suspected sinus infection with Augmentin x7 days.  Soon after finishing her Augmentin she developed vaginal burning/itching, feeling as though her vagina was swollen and having "a lot" of whitish thick vaginal discharge.  She has not noticed an odor.  She did have unprotected sex around September 3 with a new partner and then had recurrent unprotected sex with the same partner on September 13.  She was treated on September 19 with Augmentin.  She tried over-the-counter Monistat which helped "a little bit" but did not resolve her symptoms.  She continues to have nasal congestion, feeling of stuffy nose, adductive cough, and scratchy throat.  She denies fevers, chills, abdominal pain, shortness of breath, or wheezing   Review of Systems See HPI   Past Medical History:  Diagnosis Date   Seasonal allergies     Social History   Socioeconomic History   Marital status: Single    Spouse name: Not on file   Number of children: Not on file   Years of education: Not on file   Highest education level: Not on file  Occupational History   Occupation: Student  Tobacco Use   Smoking status: Never    Passive exposure: Yes   Smokeless tobacco: Never  Vaping Use   Vaping Use: Some days  Substance and Sexual Activity   Alcohol use: Yes    Alcohol/week: 1.0 standard drink    Types: 1 Glasses of wine per week    Comment: Occassionally   Drug use: Not Currently    Types: Marijuana   Sexual activity: Yes    Birth control/protection: Pill  Other Topics Concern   Not on file  Social History Narrative   In 10 grade at [employer]   Lives with mom Reghan Thul and step dad    Mom office manager  father construction   Volleyball   Household 3 negative pets firearms positive ETS stepfather.    Social Determinants of Health   Financial Resource Strain: Not on file  Food Insecurity: Not on file  Transportation Needs: Not on file  Physical Activity: Not on file  Stress: Not on file  Social Connections: Not on file  Intimate Partner Violence: Not on file    Past Surgical History:  Procedure Laterality Date   NO PAST SURGERIES      Family History  Problem Relation Age of Onset   Anemia Mother    Hypertension Other    Deafness Other     No Known Allergies  Current Outpatient Medications on File Prior to Visit  Medication Sig Dispense Refill   Cyanocobalamin (B-12) 2500 MCG SUBL Place 1 tablet under the tongue daily.     ferrous sulfate 325 (65 FE) MG tablet Take 325 mg by mouth daily with breakfast.     levonorgestrel-ethinyl estradiol (PORTIA-28) 0.15-30 MG-MCG tablet Take 1 tablet by mouth daily. 84 tablet 3   Multiple Vitamin (MULTIVITAMIN ADULT PO)      PHEXXI 1.8-1-0.4 % GEL Place vaginally.     TURMERIC PO Take 1 tablet by mouth daily.     No current facility-administered medications on file prior to visit.  BP 110/70   Pulse (!) 105   Temp 99 F (37.2 C) (Oral)   Ht 5\' 5"  (1.651 m)   Wt 118 lb (53.5 kg)   SpO2 97%   BMI 19.64 kg/m       Objective:   Physical Exam Vitals and nursing note reviewed. Exam conducted with a chaperone present.  Constitutional:      Appearance: Normal appearance.  HENT:     Nose: No congestion or rhinorrhea.     Right Turbinates: Enlarged.     Left Turbinates: Enlarged.     Right Sinus: Maxillary sinus tenderness present. No frontal sinus tenderness.     Left Sinus: Maxillary sinus tenderness present. No frontal sinus tenderness.     Mouth/Throat:     Mouth: Mucous membranes are moist.     Pharynx: Oropharynx is clear. No oropharyngeal exudate or posterior oropharyngeal erythema.  Cardiovascular:     Rate and  Rhythm: Normal rate and regular rhythm.     Pulses: Normal pulses.     Heart sounds: Normal heart sounds.  Pulmonary:     Effort: Pulmonary effort is normal.     Breath sounds: Normal breath sounds.  Abdominal:     General: Abdomen is flat. Bowel sounds are normal.     Palpations: Abdomen is soft.  Genitourinary:    General: Normal vulva.     Pubic Area: No rash.      Labia:        Right: Rash present.        Left: Rash present.      Vagina: Vaginal discharge present.     Cervix: Discharge present. No cervical bleeding.     Comments: Red flat rash around labia Significant thin white adherent drainage noticed.  Negative whiff test. Musculoskeletal:        General: Normal range of motion.  Skin:    General: Skin is warm and dry.  Neurological:     General: No focal deficit present.     Mental Status: She is alert and oriented to person, place, and time.  Psychiatric:        Mood and Affect: Mood normal.        Behavior: Behavior normal.        Thought Content: Thought content normal.      Assessment & Plan:  1. Vaginal discharge -We will cover with Flagyl for bacterial vaginosis and Diflucan for yeast infection.  She was encouraged safe sex practices.  We will follow-up once labs have returned. - PAP [[facility]]; Future - Cervicovaginal ancillary only  2. Upper respiratory infection, viral -Does not appear to [facility].  Possibly seasonal allergy mediated.  Will prescribe prednisone. - predniSONE (DELTASONE) 20 MG tablet; Take 1 tablet (20 mg total) by mouth daily with breakfast.  Dispense: 7 tablet; Refill: 0  3. Bacterial vaginosis  - PAP [[facility]]; Future - metroNIDAZOLE (FLAGYL) 500 MG tablet; Take 1 tablet (500 mg total) by mouth 3 (three) times daily for 7 days.  Dispense: 21 tablet; Refill: 0 - Cervicovaginal ancillary only  4. Yeast infection involving the vagina and surrounding area  - PAP [[facility]]; Future - fluconazole (DIFLUCAN) 150 MG tablet; Take  one tablet and if needed can take the second tablet in 3 days.  Dispense: 2 tablet; Refill: 0 - Cervicovaginal ancillary only

## 2020-11-25 ENCOUNTER — Other Ambulatory Visit (HOSPITAL_COMMUNITY)
Admission: RE | Admit: 2020-11-25 | Discharge: 2020-11-25 | Disposition: A | Discharge status: Home / Self Care | Payer: 59 | Source: Ambulatory Visit | Attending: Adult Health | Admitting: Adult Health

## 2020-11-25 ENCOUNTER — Other Ambulatory Visit: Payer: Self-pay

## 2020-11-25 DIAGNOSIS — N898 Other specified noninflammatory disorders of vagina: Secondary | ICD-10-CM | POA: Diagnosis not present

## 2020-11-25 DIAGNOSIS — B9689 Other specified bacterial agents as the cause of diseases classified elsewhere: Secondary | ICD-10-CM | POA: Insufficient documentation

## 2020-11-25 DIAGNOSIS — B3731 Acute candidiasis of vulva and vagina: Secondary | ICD-10-CM | POA: Insufficient documentation

## 2020-11-25 DIAGNOSIS — N760 Acute vaginitis: Secondary | ICD-10-CM | POA: Diagnosis not present

## 2020-11-25 NOTE — Addendum Note (Signed)
Addended by: Claudette Laws D on: 11/25/2020 04:55 PM   Modules accepted: Orders

## 2020-11-26 LAB — CERVICOVAGINAL ANCILLARY ONLY
Bacterial Vaginitis (gardnerella): NEGATIVE
Candida Glabrata: NEGATIVE
Candida Vaginitis: POSITIVE — AB
Chlamydia: NEGATIVE
Comment: NEGATIVE
Comment: NEGATIVE
Comment: NEGATIVE
Comment: NEGATIVE
Comment: NEGATIVE
Comment: NORMAL
Neisseria Gonorrhea: NEGATIVE
Trichomonas: NEGATIVE

## 2020-11-29 LAB — CYTOLOGY - PAP
Chlamydia: NEGATIVE
Comment: NEGATIVE
Comment: NEGATIVE
Comment: NORMAL
Diagnosis: NEGATIVE
Neisseria Gonorrhea: NEGATIVE
Trichomonas: NEGATIVE

## 2020-11-30 NOTE — Progress Notes (Signed)
Called pt no answer and vm full. Will try again later.

## 2021-06-07 DIAGNOSIS — Z01419 Encounter for gynecological examination (general) (routine) without abnormal findings: Secondary | ICD-10-CM | POA: Diagnosis not present

## 2021-06-07 DIAGNOSIS — Z3040 Encounter for surveillance of contraceptives, unspecified: Secondary | ICD-10-CM | POA: Diagnosis not present

## 2021-06-07 DIAGNOSIS — Z6820 Body mass index (BMI) 20.0-20.9, adult: Secondary | ICD-10-CM | POA: Diagnosis not present

## 2021-06-07 DIAGNOSIS — N760 Acute vaginitis: Secondary | ICD-10-CM | POA: Diagnosis not present

## 2021-06-07 DIAGNOSIS — R69 Illness, unspecified: Secondary | ICD-10-CM | POA: Diagnosis not present

## 2021-06-07 DIAGNOSIS — Z113 Encounter for screening for infections with a predominantly sexual mode of transmission: Secondary | ICD-10-CM | POA: Diagnosis not present

## 2021-07-15 ENCOUNTER — Other Ambulatory Visit: Payer: Self-pay

## 2021-07-15 ENCOUNTER — Ambulatory Visit (INDEPENDENT_AMBULATORY_CARE_PROVIDER_SITE_OTHER): Payer: 59 | Admitting: Physician Assistant

## 2021-07-15 ENCOUNTER — Encounter (HOSPITAL_BASED_OUTPATIENT_CLINIC_OR_DEPARTMENT_OTHER): Payer: Self-pay

## 2021-07-15 ENCOUNTER — Emergency Department (HOSPITAL_BASED_OUTPATIENT_CLINIC_OR_DEPARTMENT_OTHER)
Admission: EM | Admit: 2021-07-15 | Discharge: 2021-07-15 | Disposition: A | Discharge status: Home / Self Care | Payer: 59 | Attending: Emergency Medicine | Admitting: Emergency Medicine

## 2021-07-15 ENCOUNTER — Encounter: Payer: Self-pay | Admitting: Physician Assistant

## 2021-07-15 VITALS — BP 100/60 | HR 115 | Temp 99.2°F | Ht 65.0 in | Wt 116.0 lb

## 2021-07-15 DIAGNOSIS — R000 Tachycardia, unspecified: Secondary | ICD-10-CM | POA: Diagnosis not present

## 2021-07-15 DIAGNOSIS — J020 Streptococcal pharyngitis: Secondary | ICD-10-CM | POA: Diagnosis not present

## 2021-07-15 DIAGNOSIS — E860 Dehydration: Secondary | ICD-10-CM | POA: Diagnosis not present

## 2021-07-15 DIAGNOSIS — J029 Acute pharyngitis, unspecified: Secondary | ICD-10-CM

## 2021-07-15 DIAGNOSIS — R519 Headache, unspecified: Secondary | ICD-10-CM | POA: Diagnosis not present

## 2021-07-15 LAB — COMPREHENSIVE METABOLIC PANEL
ALT: 10 U/L (ref 0–44)
AST: 12 U/L — ABNORMAL LOW (ref 15–41)
Albumin: 4.2 g/dL (ref 3.5–5.0)
Alkaline Phosphatase: 45 U/L (ref 38–126)
Anion gap: 9 (ref 5–15)
BUN: 5 mg/dL — ABNORMAL LOW (ref 6–20)
CO2: 24 mmol/L (ref 22–32)
Calcium: 9.3 mg/dL (ref 8.9–10.3)
Chloride: 103 mmol/L (ref 98–111)
Creatinine, Ser: 0.61 mg/dL (ref 0.44–1.00)
GFR, Estimated: >60 mL/min (ref >60)
Glucose, Bld: 98 mg/dL (ref 70–99)
Potassium: 3.8 mmol/L (ref 3.5–5.1)
Sodium: 136 mmol/L (ref 135–145)
Total Bilirubin: 0.4 mg/dL (ref 0.3–1.2)
Total Protein: 7.1 g/dL (ref 6.5–8.1)

## 2021-07-15 LAB — URINALYSIS, ROUTINE W REFLEX MICROSCOPIC
Bilirubin Urine: NEGATIVE
Glucose, UA: NEGATIVE mg/dL
Hgb urine dipstick: NEGATIVE
Ketones, ur: NEGATIVE mg/dL
Leukocytes,Ua: NEGATIVE
Nitrite: NEGATIVE
Protein, ur: NEGATIVE mg/dL
Specific Gravity, Urine: 1.006 (ref 1.005–1.030)
pH: 7.5 (ref 5.0–8.0)

## 2021-07-15 LAB — CBC
HCT: 37.3 % (ref 36.0–46.0)
Hemoglobin: 12.4 g/dL (ref 12.0–15.0)
MCH: 30.5 pg (ref 26.0–34.0)
MCHC: 33.2 g/dL (ref 30.0–36.0)
MCV: 91.6 fL (ref 80.0–100.0)
Platelets: 213 10*3/uL (ref 150–400)
RBC: 4.07 MIL/uL (ref 3.87–5.11)
RDW: 12.1 % (ref 11.5–15.5)
WBC: 16.2 10*3/uL — ABNORMAL HIGH (ref 4.0–10.5)
nRBC: 0 % (ref 0.0–0.2)

## 2021-07-15 LAB — POCT RAPID STREP A (OFFICE): Rapid Strep A Screen: POSITIVE — AB

## 2021-07-15 LAB — PREGNANCY, URINE: Preg Test, Ur: NEGATIVE

## 2021-07-15 MED ORDER — LACTATED RINGERS IV BOLUS
1000.0000 mL | Freq: Once | INTRAVENOUS | Status: AC
Start: 2021-07-15 — End: 2021-07-15
  Administered 2021-07-15: 1000 mL via INTRAVENOUS

## 2021-07-15 MED ORDER — PENICILLIN G BENZATHINE 1200000 UNIT/2ML IM SUSY
1.2000 10*6.[IU] | PREFILLED_SYRINGE | Freq: Once | INTRAMUSCULAR | Status: AC
  Administered 2021-07-15: 1.2 10*6.[IU] via INTRAMUSCULAR
  Filled 2021-07-15: qty 2

## 2021-07-15 MED ORDER — IBUPROFEN 400 MG PO TABS
600.0000 mg | ORAL_TABLET | Freq: Once | ORAL | Status: AC
  Administered 2021-07-15: 600 mg via ORAL
  Filled 2021-07-15: qty 1

## 2021-07-15 MED ORDER — ONDANSETRON HCL 4 MG/2ML IJ SOLN: 4.0000 mg | Freq: Once | INTRAMUSCULAR | Status: DC | PRN

## 2021-07-15 MED ORDER — ONDANSETRON HCL 4 MG PO TABS: 4.0000 mg | ORAL_TABLET | Freq: Four times a day (QID) | ORAL | 0 refills | Status: DC

## 2021-07-15 MED ORDER — LIDOCAINE VISCOUS HCL 2 % MT SOLN
15.0000 mL | Freq: Once | OROMUCOSAL | Status: AC
Start: 2021-07-15 — End: 2021-07-15
  Administered 2021-07-15: 15 mL via OROMUCOSAL
  Filled 2021-07-15: qty 15

## 2021-07-15 MED ORDER — LIDOCAINE VISCOUS HCL 2 % MT SOLN: 15.0000 mL | OROMUCOSAL | 0 refills | Status: DC | PRN

## 2021-07-15 NOTE — Progress Notes (Signed)
Morgan Snyder is a 21 y.o. female here for a new problem.  History of Present Illness:   Chief Complaint  Patient presents with   Sore Throat    Pt c/o sore throat , body aches, fever but does not have a thermometer.She has been using Therma flu and Dayquil. She is having nausea, vomited this morning.    HPI   Sore Throat  Patient complains of sore throat that has been onset for the past 2 days. Her associated symptoms include cough, fever and body aches. She has been experiencing nausea and has had vomited this morning. She has tried Lobbyist and Dayquil cold/flu with no relief. States she is unable to eat and drink due to pain and nausea. She has not eaten full meal since yesterday morning. She has had orange juice this morning but this made her vomit. Has had decreased appetite and she is dehydrated. She's getting lightheaded when walking. Had subjective fever at home.   Past Medical History:  Diagnosis Date   Seasonal allergies      Social History   Tobacco Use   Smoking status: Never    Passive exposure: Yes   Smokeless tobacco: Never  Vaping Use   Vaping Use: Some days  Substance Use Topics   Alcohol use: Yes    Alcohol/week: 1.0 standard drink    Types: 1 Glasses of wine per week    Comment: Occassionally   Drug use: Not Currently    Types: Marijuana    Past Surgical History:  Procedure Laterality Date   NO PAST SURGERIES      Family History  Problem Relation Age of Onset   Anemia Mother    Hypertension Other    Deafness Other     No Known Allergies  Current Medications:   Current Outpatient Medications:    Cyanocobalamin (B-12) 2500 MCG SUBL, Place 1 tablet under the tongue daily., Disp: , Rfl:    ferrous sulfate 325 (65 FE) MG tablet, Take 325 mg by mouth daily with breakfast., Disp: , Rfl:    TURMERIC PO, Take 1 tablet by mouth daily., Disp: , Rfl:    levonorgestrel-ethinyl estradiol (PORTIA-28) 0.15-30 MG-MCG tablet, Take 1 tablet by  mouth daily. (Patient not taking: Reported on 07/15/2021), Disp: 84 tablet, Rfl: 3   PHEXXI 1.8-1-0.4 % GEL, Place vaginally. (Patient not taking: Reported on 07/15/2021), Disp: , Rfl:    Review of Systems:   ROS Negative unless otherwise specified per HPI.   Vitals:   Vitals:   07/15/21 1123  BP: 100/60  Pulse: (!) 115  Temp: 99.2 F (37.3 C)  TempSrc: Temporal  SpO2: 96%  Weight: 116 lb (52.6 kg)  Height: 5\' 5"  (1.651 m)     Body mass index is 19.3 kg/m.  Physical Exam:   Physical Exam Vitals and nursing note reviewed.  Constitutional:      General: She is not in acute distress.    Appearance: She is well-developed. She is not ill-appearing or toxic-appearing.  HENT:     Head: Normocephalic and atraumatic.     Right Ear: Tympanic membrane, ear canal and external ear normal. Tympanic membrane is not erythematous, retracted or bulging.     Left Ear: Tympanic membrane, ear canal and external ear normal. Tympanic membrane is not erythematous, retracted or bulging.     Nose: Nose normal.     Right Sinus: No maxillary sinus tenderness or frontal sinus tenderness.     Left Sinus: No maxillary sinus tenderness  or frontal sinus tenderness.     Mouth/Throat:     Pharynx: Uvula midline. Posterior oropharyngeal erythema present.     Tonsils: 3+ on the right. 3+ on the left.  Eyes:     General: Lids are normal.     Conjunctiva/sclera: Conjunctivae normal.  Neck:     Trachea: Trachea normal.  Cardiovascular:     Rate and Rhythm: Normal rate and regular rhythm.     Pulses: Normal pulses.     Heart sounds: Normal heart sounds, S1 normal and S2 normal.  Pulmonary:     Effort: Pulmonary effort is normal.     Breath sounds: Normal breath sounds. No decreased breath sounds, wheezing, rhonchi or rales.  Lymphadenopathy:     Cervical: No cervical adenopathy.  Skin:    General: Skin is warm and dry.  Neurological:     Mental Status: She is alert.     GCS: GCS eye subscore is 4.  GCS verbal subscore is 5. GCS motor subscore is 6.  Psychiatric:        Speech: Speech normal.        Behavior: Behavior normal. Behavior is cooperative.   Results for orders placed or performed in visit on 07/15/21  POCT rapid strep A  Result Value Ref Range   Rapid Strep A Screen Positive (A) Negative    Assessment and Plan:   Sore throat; Dehydration Strep test is positive She is quite dehydrated on exam and I suspect needs IVF When discussing options for treatment, she does not anticipate that she can tolerate them at this time Recommend ER evaluation for appropriate intervention  I,Savera Zaman,acting as a scribe for [employer], PA.,have documented all relevant documentation on the behalf of Inda Coke, PA,as directed by  Inda Coke, PA while in the presence of Inda Coke, [place].   I, Inda Coke, [place], have reviewed all documentation for this visit. The documentation on 07/15/21 for the exam, diagnosis, procedures, and orders are all accurate and complete.   Inda Coke, PA-C

## 2021-07-15 NOTE — ED Notes (Signed)
Pt d/c home with parent per MD order. Discharge summary reviewed, pt verbalizes understanding. Ambulatory off unit. No s/s of acute distress noted at discharge.

## 2021-07-15 NOTE — ED Provider Notes (Signed)
[facility]   CSN: [id number] Arrival date & time: 07/15/21  1148     History Chief Complaint  Patient presents with   Dehydration    Morgan Snyder is a 21 y.o. female with h/o anemia presents to the ED for evaluation of sore throat, headache, nausea, vomiting, and diffuse body aches for the past 3 days. She denies any abdominal pain, trouble swallowing, chest pain, or SOB. She had tried Dayquil and Nyquil for her symptoms without much relief. She reports she doesn't have much of an appetite and isn't eating and drinking, last meal was yesterday. She reports she has had two vomiting episodes over the past three days as well. [facility] for possible dehydration and IVF.   HPI     Home Medications Prior to Admission medications   Medication Sig Start Date End Date Taking? Authorizing Provider  Cyanocobalamin (B-12) 2500 MCG SUBL Place 1 tablet under the tongue daily.    [provider]  ferrous sulfate 325 (65 FE) MG tablet Take 325 mg by mouth daily with breakfast.    [provider]  levonorgestrel-ethinyl estradiol (PORTIA-28) 0.15-30 MG-MCG tablet Take 1 tablet by mouth daily. Patient not taking: Reported on 07/15/2021 10/16/16   Panosh, Neta MendsWanda K, MD  PHEXXI 1.8-1-0.4 % GEL Place vaginally. Patient not taking: Reported on 07/15/2021 08/01/19   [provider]  TURMERIC PO Take 1 tablet by mouth daily.    [provider]      Allergies    Patient has no known allergies.    Review of Systems   Review of Systems  Constitutional:  Positive for fever. Negative for chills.  HENT:  Positive for congestion, rhinorrhea and sore throat. Negative for trouble swallowing.   Respiratory:  Negative for shortness of breath.   Cardiovascular:  Negative for chest pain.  Gastrointestinal:  Positive for nausea and vomiting. Negative for abdominal pain, constipation and  diarrhea.  Genitourinary:  Negative for dysuria and hematuria.  Musculoskeletal:  Positive for myalgias.  Neurological:  Positive for headaches. Negative for syncope and weakness.   Physical Exam Updated Vital Signs BP 100/73 (BP Location: Left Arm)   Pulse (!) 106   Temp 98.9 F (37.2 C) (Oral)   Resp 20   Ht 5\' 4"  (1.626 m)   Wt 52.6 kg   LMP 07/03/2021 (Exact Date)   SpO2 100%   BMI 19.91 kg/m  Physical Exam Vitals and nursing note reviewed.  Constitutional:      General: She is not in acute distress.    Appearance: Normal appearance. She is not toxic-appearing.  HENT:     Head: Normocephalic and atraumatic.     Right Ear: Tympanic membrane, ear canal and external ear normal.     Left Ear: Tympanic membrane, ear canal and external ear normal.     Nose: Nose normal.     Mouth/Throat:     Mouth: Mucous membranes are dry.     Pharynx: Posterior oropharyngeal erythema present.     Comments: Pharyngeal erythema without exudate. Tonsils 3+ bilaterally. Uvula is midline. No soft palate deformity. Airway is patent. Patient is tolerating secretions.  Eyes:     General: No scleral icterus. Neck:     Comments: Bilateral tonsillar LAD. No overlying skin changes noted. Full ROM of neck.  Cardiovascular:     Rate and Rhythm: Regular rhythm. Tachycardia present.  Pulmonary:     Effort: Pulmonary effort  is normal. No respiratory distress.     Breath sounds: Normal breath sounds.  Abdominal:     General: Abdomen is flat. Bowel sounds are normal.     Palpations: Abdomen is soft.     Tenderness: There is no abdominal tenderness. There is no guarding or rebound.  Musculoskeletal:        General: No deformity.     Cervical back: Normal range of motion.     Comments: Diffuse back tenderness, no point tenderness. No overlying skin changes or increased warmth.   Lymphadenopathy:     Cervical: Cervical adenopathy present.  Skin:    General: Skin is warm and dry.  Neurological:      General: No focal deficit present.     Mental Status: She is alert. Mental status is at baseline.    ED Results / Procedures / Treatments   Labs (all labs ordered are listed, but only abnormal results are displayed) Labs Reviewed  CBC - Abnormal; Notable for the following components:      Result Value   WBC 16.2 (*)    All other components within normal limits  URINALYSIS, ROUTINE W REFLEX MICROSCOPIC - Abnormal; Notable for the following components:   Color, Urine COLORLESS (*)    All other components within normal limits  COMPREHENSIVE METABOLIC PANEL  PREGNANCY, URINE    EKG None  Radiology No results found.  Procedures Procedures   Medications Ordered in ED Medications  ondansetron (ZOFRAN) injection 4 mg (has no administration in time range)  lactated ringers bolus 1,000 mL (1,000 mLs Intravenous New Bag/Given 07/15/21 1235)    ED Course/ Medical Decision Making/ A&P                           Medical Decision Making Amount and/or Complexity of Data Reviewed Labs: ordered.  Risk Prescription drug management.   21 year old female presents the emergency department from urgent care for evaluation of possible dehydration and positive strep test.  Differential diagnosis includes but is not limited to dehydration, sepsis, strep throat, PTA, meningitis, electrolyte abnormality.  Vital signs show mild tachycardia at 106 otherwise normotensive, afebrile, satting well room air without any increased work of breathing.  Physical exam is pertinent for pharyngeal erythema without exudate. Tonsils 3+ bilaterally. Uvula is midline. No soft palate deformity. Airway is patent. Patient is tolerating secretions. Bilateral tonsillar LAD. No overlying skin changes noted. Full ROM of neck. Diffuse back tenderness, no point tenderness. No overlying skin changes or increased warmth.    Strep test was positive in urgent care, do not see need to repeat it.  CBC does show an elevated white  blood count at 16.2, could be due to patient's vomiting and strep throat.  CMP shows no electrolyte abnormality although she does have a small decrease in her BUN at 5 with an AST at 12.  No LFT other abnormality.  Urinalysis shows colorless urine that is has normal specific gravity if not more dilute.  Pregnancy test negative.  Discussed with patient the need for treatment for strep throat that will require an antibiotic.  I offer her to take an antibiotic twice daily for the next 10 days or we could do the Bicillin one-time dosing.  She has chosen to do the Bicillin dosing.  She was given 1 L of LR as well as some Zofran, ibuprofen, and viscous lidocaine.  I brought her in some Gatorade and ice for her to try to  drink as well.  On reevaluation after the fluids, her heart rate went down to 84.  She reports she is feeling much better.  She was able to tolerate the Gatorade without any vomiting.  At this time, the patient does not have any other symptoms of sepsis.  She is afebrile, her heart rate is improved, she is normotensive, and satting well on room air without any increased work of breathing.  I doubt any PTA as the patient has symmetrical enlarged tonsils and her uvula is midline.  Her airway is patent and she is controlling her secretions.  Doubt any meningitis as the patient has full range of motion of her neck.  There is no electrolyte abnormality seen on her lab work.  At this time, I do think the patient is safe for discharge.  I discussed with her her labs.  We discussed using ibuprofen and the viscous lidocaine and sending her home with for her pain control.  We discussed strict return precautions and red flag symptoms of peritonsillar abscess, worsening strep throat, and meningitis.  Encouraged that she drink plenty of fluids, mainly water.  We discussed using soft foods to help with her throat pain.  Patient and family member verbalized understanding and agrees to plan.  Patient is stable  being discharged home in good condition.  Final Clinical Impression(s) / ED Diagnoses Final diagnoses:  Dehydration  Strep throat    Rx / DC Orders ED Discharge Orders          Ordered    ondansetron (ZOFRAN) 4 MG tablet  Every 6 hours        07/15/21 1428    lidocaine (XYLOCAINE) 2 % solution  As needed        07/15/21 1428              Achille Rich, [place] 07/15/21 1439    Milagros Loll, MD 07/16/21 [phone]

## 2021-07-15 NOTE — Patient Instructions (Signed)
It was great to see you!  Please go to the [facility] for your dehydration Address: [address] [place], [place], [place] [address] Phone: [phone]  Take care,  Inda Coke PA-C

## 2021-07-15 NOTE — Discharge Instructions (Addendum)
You were seen in the emergency department for evaluation of your sore throat and possible dehydration.  We gave you a bag of IV fluids, but is more important that you are able to take fluids by mouth.  I have sent you home with some viscous lidocaine and some Zofran which is an antinausea medication to help with this.  Please make sure you are staying well-hydrated with drinking plenty of water.  You can take Tylenol or ibuprofen as needed for pain.  I will try soft foods such as popsicles, ice potatoes, soup for your sore throat.  If you have any worsening throat pain, inability to swallow, drooling, worsening headache, worsening neck pain, inability to move your head, or fever not improved with Tylenol or ibuprofen, you need to return to the emergency department immediately for evaluation.  Contact a health care provider if: You have swelling in your neck that keeps getting bigger. You develop a rash, cough, or earache. You cough up a thick mucus that is green, yellow-brown, or bloody. You have pain or discomfort that does not get better with medicine. Your symptoms seem to be getting worse. You have a fever. Get help right away if: You have new symptoms, such as vomiting, severe headache, stiff or painful neck, chest pain, or shortness of breath. You have severe throat pain, drooling, or changes in your voice. You have swelling of the neck, or the skin on the neck becomes red and tender. You have signs of dehydration, such as tiredness (fatigue), dry mouth, and decreased urination. You become increasingly sleepy, or you cannot wake up completely. Your joints become red or painful. These symptoms may represent a serious problem that is an emergency. Do not wait to see if the symptoms will go away. Get medical help right away. Call your local emergency services (911 in the [place] Do not drive yourself to the hospital.

## 2021-07-15 NOTE — ED Triage Notes (Signed)
Pt sent here by [facility] for dehydration. Pt is _ for strep pharyngitis. Pt has had symptoms for 3 days. C/o sore throat, general aches, fever, and N/V. Pt unable to keep down liquids. UC has not given any antiemetics or antibiotics.

## 2021-09-13 ENCOUNTER — Encounter: Payer: Self-pay | Admitting: Internal Medicine

## 2021-09-13 ENCOUNTER — Ambulatory Visit (INDEPENDENT_AMBULATORY_CARE_PROVIDER_SITE_OTHER): Payer: 59 | Admitting: Internal Medicine

## 2021-09-13 VITALS — BP 96/62 | HR 70 | Temp 98.2°F | Ht 65.0 in | Wt 115.4 lb

## 2021-09-13 DIAGNOSIS — Z0000 Encounter for general adult medical examination without abnormal findings: Secondary | ICD-10-CM | POA: Diagnosis not present

## 2021-09-13 DIAGNOSIS — Z23 Encounter for immunization: Secondary | ICD-10-CM | POA: Diagnosis not present

## 2021-09-13 NOTE — Patient Instructions (Signed)
Good to see  you today . Exam is normal . Stay hydrated  try not to skip meal even if a snack.   Tdap today . If all ok the yearly check  (May also advise updated hpv if not done )

## 2021-09-13 NOTE — Progress Notes (Signed)
Chief Complaint  Patient presents with   Annual Exam    Fasting     HPI: Patient  Morgan Snyder  21 y.o. comes in today for Preventive Health Care visit  Doing well no new med problems  Vision  accomodation.  Is slow but otherwise no vision issues  NO on ocps yet  .  Had strep throat  better  . See psat notes   Health Maintenance  Topic Date Due   HIV Screening  Never done   HPV VACCINES (2 - 3-dose series) 12/23/2014   Hepatitis C Screening  Never done   COVID-19 Vaccine (1) 03/16/2022 (Originally 09/05/2000)   INFLUENZA VACCINE  09/20/2021   CHLAMYDIA SCREENING  11/24/2021   PAP-Cervical Cytology Screening  11/26/2023   PAP SMEAR-Modifier  11/26/2023   TETANUS/TDAP  09/14/2031   Health Maintenance Review LIFESTYLE:  Exercise:  active but no plan .  Tobacco/ETS: vapes daily   Alcohol: 2-3 per week  Sugar beverages:  2 per week Sleep: 9 and now less.   Drug use: no HH of   recently new house  appt. 1 pet  pit bull.  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  12-3 and 11 - 6  feeling well  GYNE: utd  hasnt started  yet.  Some melatonin   ROS:  REST of 12 system review negative except as per HPI   Past Medical History:  Diagnosis Date   Seasonal allergies     Past Surgical History:  Procedure Laterality Date   NO PAST SURGERIES      Family History  Problem Relation Age of Onset   Anemia Mother    Hypertension Other    Deafness Other     Social History   Socioeconomic History   Marital status: Single    Spouse name: Not on file   Number of children: Not on file   Years of education: Not on file   Highest education level: Not on file  Occupational History   Occupation: Student  Tobacco Use   Smoking status: Never    Passive exposure: Yes   Smokeless tobacco: Never  Vaping Use   Vaping Use: Every day  Substance and Sexual Activity   Alcohol use: Yes    Alcohol/week: 1.0 standard  drink of alcohol    Types: 1 Glasses of wine per week    Comment: Occassionally   Drug use: Not Currently    Types: Marijuana   Sexual activity: Yes    Birth control/protection: Pill  Other Topics Concern   Not on file  Social History Narrative   In 10 grade at [employer]   Lives with mom Morgan Snyder and step dad    Mom office manager father construction   Volleyball   Household 3 negative pets firearms positive ETS stepfather.    Social Determinants of Health   Financial Resource Strain: Not on file  Food Insecurity: Not on file  Transportation Needs: Not on file  Physical Activity: Not on file  Stress: Not on file  Social Connections: Not on file    Outpatient Medications Prior to Visit  Medication Sig Dispense Refill   TURMERIC PO Take 1 tablet by mouth daily.     Cyanocobalamin (B-12) 2500 MCG SUBL Place 1 tablet under the tongue daily. (Patient not taking: Reported on 09/13/2021)     ferrous sulfate 325 (65 FE) MG tablet Take 325  mg by mouth daily with breakfast. (Patient not taking: Reported on 09/13/2021)     levonorgestrel-ethinyl estradiol (PORTIA-28) 0.15-30 MG-MCG tablet Take 1 tablet by mouth daily. (Patient not taking: Reported on 09/13/2021) 84 tablet 3   lidocaine (XYLOCAINE) 2 % solution Use as directed 15 mLs in the mouth or throat as needed for mouth pain. (Patient not taking: Reported on 09/13/2021) 100 mL 0   ondansetron (ZOFRAN) 4 MG tablet Take 1 tablet (4 mg total) by mouth every 6 (six) hours. (Patient not taking: Reported on 09/13/2021) 12 tablet 0   PHEXXI 1.8-1-0.4 % GEL Place vaginally. (Patient not taking: Reported on 09/13/2021)     No facility-administered medications prior to visit.     EXAM:  BP 96/62 (BP Location: Left Arm, Patient Position: Sitting, Cuff Size: Normal)   Pulse 70   Temp 98.2 F (36.8 C) (Oral)   Ht 5\' 5"  (1.651 m)   Wt 115 lb 6.4 oz (52.3 kg)   LMP 08/20/2021   SpO2 99%   BMI 19.20 kg/m   Body mass index is 19.2  kg/m. Wt Readings from Last 3 Encounters:  09/13/21 115 lb 6.4 oz (52.3 kg)  07/15/21 116 lb (52.6 kg)  07/15/21 116 lb (52.6 kg)    Physical Exam: Vital signs reviewed 07/17/21 is a well-developed well-nourished alert cooperative    who appearsr stated age in no acute distress.  HEENT: normocephalic atraumatic , Eyes: PERRL EOM's full, conjunctiva clear, Nares: paten,t no deformity discharge or tenderness., Ears: no deformity EAC's clear TMs with normal landmarks.op  nl airway  NECK: supple without masses, thyromegaly or bruits. CHEST/PULM:  Clear to auscultation and percussion breath sounds equal no wheeze , rales or rhonchi. No chest wall deformities or tenderness. Breast: normal by inspection . No dimpling, discharge, masses, tenderness or discharge . CV: PMI is nondisplaced, S1 S2 no gallops, murmurs, rubs. Peripheral pulses are full without delay.No JVD .  ABDOMEN: Bowel sounds normal nontender  No guard or rebound, no hepato splenomegal no CVA tenderness.   Extremtities:  No clubbing cyanosis or edema, no acute joint swelling or redness no focal atrophy NEURO:  Oriented x3, cranial nerves 3-12 appear to be intact, no obvious focal weakness,gait within normal limits no abnormal reflexes or asymmetrical SKIN: No acute rashes normal turgor, color, no bruising or petechiae. PSYCH: Oriented, good eye contact, no obvious depression anxiety, cognition and judgment appear normal. LN: no cervical axillary inguinal adenopathy  Lab Results  Component Value Date   WBC 16.2 (H) 07/15/2021   HGB 12.4 07/15/2021   HCT 37.3 07/15/2021   PLT 213 07/15/2021   GLUCOSE 98 07/15/2021   CHOL 115 11/25/2014   TRIG 69.0 11/25/2014   HDL 47.10 11/25/2014   LDLCALC 54 11/25/2014   ALT 10 07/15/2021   AST 12 (L) 07/15/2021   NA 136 07/15/2021   K 3.8 07/15/2021   CL 103 07/15/2021   CREATININE 0.61 07/15/2021   BUN 5 (L) 07/15/2021   CO2 24 07/15/2021   TSH 1.21 11/25/2014    BP Readings from  Last 3 Encounters:  09/13/21 96/62  07/15/21 109/77  07/15/21 100/60    Disc prev lab   ASSESSMENT AND PLAN:  Discussed the following assessment and plan:    ICD-10-CM   1. Visit for preventive health examination  Z00.00 Tdap vaccine greater than or equal to 7yo IM    Healthy  nl exam   No indication to repeat blood work today  elevated wbc feel  from acute infection   No anemia , favorable lipids in past   Return in about 1 year (around 09/14/2022) for preventive /cpx .  Patient Care Team: Sonoma Firkus, Neta Mends, MD as PCP - General (Internal Medicine) Patient Instructions  Good to see  you today . Exam is normal . Stay hydrated  try not to skip meal even if a snack.   Tdap today . If all ok the yearly check  (May also advise updated hpv if not done )  Neta Mends. Hulda Reddix M.D.

## 2021-09-17 ENCOUNTER — Encounter: Payer: Self-pay | Admitting: Internal Medicine

## 2021-10-14 ENCOUNTER — Encounter: Payer: Self-pay | Admitting: Family Medicine

## 2021-10-14 ENCOUNTER — Ambulatory Visit (INDEPENDENT_AMBULATORY_CARE_PROVIDER_SITE_OTHER): Payer: 59 | Admitting: Family Medicine

## 2021-10-14 DIAGNOSIS — M545 Low back pain, unspecified: Secondary | ICD-10-CM | POA: Diagnosis not present

## 2021-10-14 DIAGNOSIS — M542 Cervicalgia: Secondary | ICD-10-CM | POA: Diagnosis not present

## 2021-10-14 DIAGNOSIS — S161XXA Strain of muscle, fascia and tendon at neck level, initial encounter: Secondary | ICD-10-CM

## 2021-10-14 DIAGNOSIS — K219 Gastro-esophageal reflux disease without esophagitis: Secondary | ICD-10-CM

## 2021-10-14 MED ORDER — OMEPRAZOLE 20 MG PO CPDR: 20.0000 mg | DELAYED_RELEASE_CAPSULE | Freq: Every day | ORAL | 0 refills | Status: DC

## 2021-10-14 MED ORDER — MELOXICAM 7.5 MG PO TABS: 7.5000 mg | ORAL_TABLET | Freq: Every day | ORAL | 0 refills | Status: DC
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
## 2021-11-06 NOTE — Progress Notes (Unsigned)
No chief complaint on file.   HPI: Morgan Snyder 21 y.o. come in for ROS: See pertinent positives and negatives per HPI.  Past Medical History:  Diagnosis Date   Seasonal allergies     Family History  Problem Relation Age of Onset   Anemia Mother    Hypertension Other    Deafness Other     Social History   Socioeconomic History   Marital status: Single    Spouse name: Not on file   Number of children: Not on file   Years of education: Not on file   Highest education level: Not on file  Occupational History   Occupation: Student  Tobacco Use   Smoking status: Never    Passive exposure: Yes   Smokeless tobacco: Never  Vaping Use   Vaping Use: Every day  Substance and Sexual Activity   Alcohol use: Yes    Alcohol/week: 1.0 standard drink of alcohol    Types: 1 Glasses of wine per week    Comment: Occassionally   Drug use: Not Currently    Types: Marijuana   Sexual activity: Yes    Birth control/protection: Pill  Other Topics Concern   Not on file  Social History Narrative   In 10 grade at [employer]   Lives with mom Morgan Snyder and step dad    Mom office manager father construction   Volleyball   Household 3 negative pets firearms positive ETS stepfather.    Social Determinants of Health   Financial Resource Strain: Not on file  Food Insecurity: Not on file  Transportation Needs: Not on file  Physical Activity: Not on file  Stress: Not on file  Social Connections: Not on file    Outpatient Medications Prior to Visit  Medication Sig Dispense Refill   ferrous sulfate 325 (65 FE) MG tablet Take 325 mg by mouth every other day.     meloxicam (MOBIC) 7.5 MG tablet Take 1 tablet (7.5 mg total) by mouth daily. 30 tablet 0   omeprazole (PRILOSEC) 20 MG capsule Take 1 capsule (20 mg total) by mouth daily. 30 capsule 0   TURMERIC PO Take 1 tablet by mouth daily. (Patient not taking: Reported on 10/14/2021)     No facility-administered  medications prior to visit.     EXAM:  LMP 09/20/2021 (Exact Date)   There is no height or weight on file to calculate BMI.  GENERAL: vitals reviewed and listed above, alert, oriented, appears well hydrated and in no acute distress HEENT: atraumatic, conjunctiva  clear, no obvious abnormalities on inspection of external nose and ears OP : no lesion edema or exudate  NECK: no obvious masses on inspection palpation  LUNGS: clear to auscultation bilaterally, no wheezes, rales or rhonchi, good air movement CV: HRRR, no clubbing cyanosis or  peripheral edema nl cap refill  MS: moves all extremities without noticeable focal  abnormality PSYCH: pleasant and cooperative, no obvious depression or anxiety Lab Results  Component Value Date   WBC 16.2 (H) 07/15/2021   HGB 12.4 07/15/2021   HCT 37.3 07/15/2021   PLT 213 07/15/2021   GLUCOSE 98 07/15/2021   CHOL 115 11/25/2014   TRIG 69.0 11/25/2014   HDL 47.10 11/25/2014   LDLCALC 54 11/25/2014   ALT 10 07/15/2021   AST 12 (L) 07/15/2021   NA 136 07/15/2021   K 3.8 07/15/2021   CL 103 07/15/2021   CREATININE 0.61 07/15/2021   BUN 5 (L) 07/15/2021   CO2  24 07/15/2021   TSH 1.21 11/25/2014   BP Readings from Last 3 Encounters:  10/14/21 (!) 90/52  09/13/21 96/62  07/15/21 109/77    ASSESSMENT AND PLAN:  Discussed the following assessment and plan:  No diagnosis found.  -Patient advised to return or notify health care team  if  new concerns arise.  There are no Patient Instructions on file for this visit.   Standley Brooking. Pinchos Topel M.D.

## 2021-11-07 ENCOUNTER — Ambulatory Visit (INDEPENDENT_AMBULATORY_CARE_PROVIDER_SITE_OTHER): Payer: 59 | Admitting: Internal Medicine

## 2021-11-07 ENCOUNTER — Encounter: Payer: Self-pay | Admitting: Internal Medicine

## 2021-11-07 VITALS — BP 118/80 | HR 76 | Temp 98.0°F | Wt 116.2 lb

## 2021-11-07 DIAGNOSIS — L84 Corns and callosities: Secondary | ICD-10-CM | POA: Diagnosis not present

## 2021-11-07 NOTE — Patient Instructions (Signed)
This is a corn  Soak relieve pressure and  good fitting shoes   Can have derm podaitry  check if needed  But should be able to  self treat.

## 2021-11-10 ENCOUNTER — Encounter: Payer: Self-pay | Admitting: Internal Medicine

## 2021-11-18 ENCOUNTER — Encounter: Payer: Self-pay | Admitting: Adult Health

## 2021-11-18 ENCOUNTER — Ambulatory Visit (INDEPENDENT_AMBULATORY_CARE_PROVIDER_SITE_OTHER): Payer: 59 | Admitting: Adult Health

## 2021-11-18 ENCOUNTER — Ambulatory Visit (INDEPENDENT_AMBULATORY_CARE_PROVIDER_SITE_OTHER): Payer: 59

## 2021-11-18 VITALS — BP 100/70 | HR 76 | Temp 98.2°F | Ht 65.0 in | Wt 116.0 lb

## 2021-11-18 DIAGNOSIS — R9389 Abnormal findings on diagnostic imaging of other specified body structures: Secondary | ICD-10-CM

## 2021-11-18 DIAGNOSIS — Z720 Tobacco use: Secondary | ICD-10-CM

## 2021-11-18 DIAGNOSIS — R918 Other nonspecific abnormal finding of lung field: Secondary | ICD-10-CM | POA: Diagnosis not present

## 2021-11-18 NOTE — Progress Notes (Signed)
Subjective:    Patient ID: Morgan Snyder, female    DOB: 23-Nov-2000, 21 y.o.   MRN: [id number]  HPI 21 year old female who  has a past medical history of Seasonal allergies.  She reports that he was seen by orthopedics recently for back pain.  They did x-rays and reported 3 small white dots on her right chest.  She did not have any metal in her bra, has not had any recent breast surgery, and has no piercings.  She does vape. Does not smoke cigarettes or smoke marijuana    Review of Systems See HPI   Past Medical History:  Diagnosis Date   Seasonal allergies     Social History   Socioeconomic History   Marital status: Single    Spouse name: Not on file   Number of children: Not on file   Years of education: Not on file   Highest education level: Not on file  Occupational History   Occupation: Student  Tobacco Use   Smoking status: Never    Passive exposure: Yes   Smokeless tobacco: Never  Vaping Use   Vaping Use: Every day  Substance and Sexual Activity   Alcohol use: Yes    Alcohol/week: 1.0 standard drink of alcohol    Types: 1 Glasses of wine per week    Comment: Occassionally   Drug use: Not Currently    Types: Marijuana   Sexual activity: Yes    Birth control/protection: Pill  Other Topics Concern   Not on file  Social History Narrative   In 10 grade at [employer]   Lives with mom Morgan Snyder and step dad    Mom office manager father construction   Volleyball   Household 3 negative pets firearms positive ETS stepfather.    Social Determinants of Health   Financial Resource Strain: Not on file  Food Insecurity: Not on file  Transportation Needs: Not on file  Physical Activity: Not on file  Stress: Not on file  Social Connections: Not on file  Intimate Partner Violence: Not on file    Past Surgical History:  Procedure Laterality Date   NO PAST SURGERIES      Family History  Problem Relation Age of Onset   Anemia Mother     Hypertension Other    Deafness Other     No Known Allergies  Current Outpatient Medications on File Prior to Visit  Medication Sig Dispense Refill   ferrous sulfate 325 (65 FE) MG tablet Take 325 mg by mouth every other day.     meloxicam (MOBIC) 7.5 MG tablet Take 1 tablet (7.5 mg total) by mouth daily. 30 tablet 0   omeprazole (PRILOSEC) 20 MG capsule Take 1 capsule (20 mg total) by mouth daily. 30 capsule 0   TURMERIC PO Take 1 tablet by mouth daily.     No current facility-administered medications on file prior to visit.    BP 100/70   Pulse 76   Temp 98.2 F (36.8 C) (Oral)   Ht 5\' 5"  (1.651 m)   Wt 116 lb (52.6 kg)   LMP 10/25/2021 (Exact Date)   SpO2 98%   BMI 19.30 kg/m       Objective:   Physical Exam Vitals and nursing note reviewed.  Constitutional:      Appearance: Normal appearance.  Cardiovascular:     Rate and Rhythm: Normal rate and regular rhythm.  Pulmonary:     Effort: Pulmonary effort is normal.  Breath sounds: Normal breath sounds.  Neurological:     General: No focal deficit present.     Mental Status: She is alert and oriented to person, place, and time.  Psychiatric:        Mood and Affect: Mood normal.        Behavior: Behavior normal.        Thought Content: Thought content normal.        Judgment: Judgment normal.       Assessment & Plan:  1. Abnormal chest x-ray - Repeat chest xray today. Consider further imaging.  - DG Chest 2 View; Future  2. Vapes nicotine containing substance - encouraged to quit smoking   Dorothyann Peng, NP

## 2022-01-08 NOTE — Progress Notes (Unsigned)
No chief complaint on file.   HPI: Morgan Snyder 21 y.o. come in for  ROS: See pertinent positives and negatives per HPI.  Past Medical History:  Diagnosis Date   Seasonal allergies     Family History  Problem Relation Age of Onset   Anemia Mother    Hypertension Other    Deafness Other     Social History   Socioeconomic History   Marital status: Single    Spouse name: Not on file   Number of children: Not on file   Years of education: Not on file   Highest education level: Not on file  Occupational History   Occupation: Student  Tobacco Use   Smoking status: Never    Passive exposure: Yes   Smokeless tobacco: Never  Vaping Use   Vaping Use: Every day  Substance and Sexual Activity   Alcohol use: Yes    Alcohol/week: 1.0 standard drink of alcohol    Types: 1 Glasses of wine per week    Comment: Occassionally   Drug use: Not Currently    Types: Marijuana   Sexual activity: Yes    Birth control/protection: Pill  Other Topics Concern   Not on file  Social History Narrative      Lives with mom Heela Heishman and step dad    Mom office manager father Physicist, medical   Household 3 negative pets firearms positive ETS stepfather.    Social Determinants of Health   Financial Resource Strain: Not on file  Food Insecurity: Not on file  Transportation Needs: Not on file  Physical Activity: Not on file  Stress: Not on file  Social Connections: Not on file    Outpatient Medications Prior to Visit  Medication Sig Dispense Refill   ferrous sulfate 325 (65 FE) MG tablet Take 325 mg by mouth every other day.     meloxicam (MOBIC) 7.5 MG tablet Take 1 tablet (7.5 mg total) by mouth daily. 30 tablet 0   omeprazole (PRILOSEC) 20 MG capsule Take 1 capsule (20 mg total) by mouth daily. 30 capsule 0   TURMERIC PO Take 1 tablet by mouth daily.     No facility-administered medications prior to visit.     EXAM:  There were no vitals taken for  this visit.  There is no height or weight on file to calculate BMI.  GENERAL: vitals reviewed and listed above, alert, oriented, appears well hydrated and in no acute distress HEENT: atraumatic, conjunctiva  clear, no obvious abnormalities on inspection of external nose and ears OP : no lesion edema or exudate  NECK: no obvious masses on inspection palpation  LUNGS: clear to auscultation bilaterally, no wheezes, rales or rhonchi, good air movement CV: HRRR, no clubbing cyanosis or  peripheral edema nl cap refill  MS: moves all extremities without noticeable focal  abnormality PSYCH: pleasant and cooperative, no obvious depression or anxiety Lab Results  Component Value Date   WBC 16.2 (H) 07/15/2021   HGB 12.4 07/15/2021   HCT 37.3 07/15/2021   PLT 213 07/15/2021   GLUCOSE 98 07/15/2021   CHOL 115 11/25/2014   TRIG 69.0 11/25/2014   HDL 47.10 11/25/2014   LDLCALC 54 11/25/2014   ALT 10 07/15/2021   AST 12 (L) 07/15/2021   NA 136 07/15/2021   K 3.8 07/15/2021   CL 103 07/15/2021   CREATININE 0.61 07/15/2021   BUN 5 (L) 07/15/2021   CO2 24 07/15/2021   TSH 1.21 11/25/2014  BP Readings from Last 3 Encounters:  11/18/21 100/70  11/07/21 118/80  10/14/21 (!) 90/52    ASSESSMENT AND PLAN:  Discussed the following assessment and plan:  No diagnosis found.  -Patient advised to return or notify health care team  if  new concerns arise.  There are no Patient Instructions on file for this visit.   Neta Mends. Belkis Norbeck M.D.

## 2022-01-09 ENCOUNTER — Encounter: Payer: Self-pay | Admitting: Internal Medicine

## 2022-01-09 ENCOUNTER — Ambulatory Visit (INDEPENDENT_AMBULATORY_CARE_PROVIDER_SITE_OTHER): Payer: 59 | Admitting: Internal Medicine

## 2022-01-09 VITALS — BP 92/60 | HR 83 | Temp 97.6°F | Wt 118.0 lb

## 2022-01-09 DIAGNOSIS — R509 Fever, unspecified: Secondary | ICD-10-CM | POA: Diagnosis not present

## 2022-01-09 DIAGNOSIS — M533 Sacrococcygeal disorders, not elsewhere classified: Secondary | ICD-10-CM

## 2022-01-09 DIAGNOSIS — R112 Nausea with vomiting, unspecified: Secondary | ICD-10-CM | POA: Diagnosis not present

## 2022-01-09 DIAGNOSIS — M549 Dorsalgia, unspecified: Secondary | ICD-10-CM

## 2022-01-09 LAB — HEPATIC FUNCTION PANEL
ALT: 12 U/L (ref 0–35)
AST: 16 U/L (ref 0–37)
Albumin: 4.4 g/dL (ref 3.5–5.2)
Alkaline Phosphatase: 45 U/L (ref 39–117)
Bilirubin, Direct: 0.1 mg/dL (ref 0.0–0.3)
Total Bilirubin: 0.5 mg/dL (ref 0.2–1.2)
Total Protein: 7.5 g/dL (ref 6.0–8.3)

## 2022-01-09 LAB — POCT RAPID STREP A (OFFICE): Rapid Strep A Screen: NEGATIVE

## 2022-01-09 LAB — CBC WITH DIFFERENTIAL/PLATELET
Basophils Absolute: 0 10*3/uL (ref 0.0–0.1)
Basophils Relative: 0.4 % (ref 0.0–3.0)
Eosinophils Absolute: 0.1 10*3/uL (ref 0.0–0.7)
Eosinophils Relative: 1.2 % (ref 0.0–5.0)
HCT: 41 % (ref 36.0–46.0)
Hemoglobin: 13.8 g/dL (ref 12.0–15.0)
Lymphocytes Relative: 30 % (ref 12.0–46.0)
Lymphs Abs: 1.4 10*3/uL (ref 0.7–4.0)
MCHC: 33.7 g/dL (ref 30.0–36.0)
MCV: 90.5 fl (ref 78.0–100.0)
Monocytes Absolute: 0.4 10*3/uL (ref 0.1–1.0)
Monocytes Relative: 8.6 % (ref 3.0–12.0)
Neutro Abs: 2.8 10*3/uL (ref 1.4–7.7)
Neutrophils Relative %: 59.8 % (ref 43.0–77.0)
Platelets: 242 10*3/uL (ref 150.0–400.0)
RBC: 4.53 Mil/uL (ref 3.87–5.11)
RDW: 12.4 % (ref 11.5–15.5)
WBC: 4.7 10*3/uL (ref 4.0–10.5)

## 2022-01-09 LAB — C-REACTIVE PROTEIN: CRP: 8.3 mg/dL (ref 0.5–20.0)

## 2022-01-09 LAB — SEDIMENTATION RATE: Sed Rate: 18 mm/hr (ref 0–20)

## 2022-01-09 LAB — POCT URINALYSIS DIPSTICK
Bilirubin, UA: POSITIVE
Blood, UA: NEGATIVE
Glucose, UA: NEGATIVE
Ketones, UA: POSITIVE
Leukocytes, UA: NEGATIVE
Nitrite, UA: NEGATIVE
Protein, UA: POSITIVE — AB
Spec Grav, UA: 1.015 (ref 1.010–1.025)
Urobilinogen, UA: 1 E.U./dL
pH, UA: 6 (ref 5.0–8.0)

## 2022-01-09 LAB — BASIC METABOLIC PANEL
BUN: 7 mg/dL (ref 6–23)
CO2: 29 mEq/L (ref 19–32)
Calcium: 9.4 mg/dL (ref 8.4–10.5)
Chloride: 100 mEq/L (ref 96–112)
Creatinine, Ser: 0.65 mg/dL (ref 0.40–1.20)
GFR: 124.6 mL/min (ref >60.00)
Glucose, Bld: 84 mg/dL (ref 70–99)
Potassium: 3.5 mEq/L (ref 3.5–5.1)
Sodium: 135 mEq/L (ref 135–145)

## 2022-01-09 LAB — POC COVID19 BINAXNOW: SARS Coronavirus 2 Ag: NEGATIVE

## 2022-01-09 LAB — POCT INFLUENZA A/B
Influenza A, POC: NEGATIVE
Influenza B, POC: NEGATIVE

## 2022-01-09 NOTE — Patient Instructions (Addendum)
I think the fever and stomach symptoms are consistent with a viral gastroenteritis and should improve over time  Note for work   fever gone for 24 hours and feeling better .  Lab today  I agree that ongoing back pain  consider inflammatory conditions.   Lab today ( fever illness could effect some of the labs)   Consider seeing rheumatology  with records of imaginong and evaluation  from  orthopedics . We do not have the records to review .

## 2022-01-10 LAB — URINE CULTURE
MICRO NUMBER:: 14212607
Result:: NO GROWTH
SPECIMEN QUALITY:: ADEQUATE

## 2022-01-11 LAB — CYCLIC CITRUL PEPTIDE ANTIBODY, IGG: Cyclic Citrullin Peptide Ab: <16 UNITS

## 2022-01-11 LAB — RHEUMATOID FACTOR: Rheumatoid fact SerPl-aCnc: <14 IU/mL (ref <14)

## 2022-01-11 LAB — HLA-B27 ANTIGEN: HLA-B27 Antigen: NEGATIVE

## 2022-01-11 LAB — ANA: Anti Nuclear Antibody (ANA): NEGATIVE

## 2022-01-11 NOTE — Progress Notes (Signed)
Good news that the blood markers are all normal  but doesn't help  diagnose the back pain .  Let us know if you want a rheumatology referral but would make sure you have  records from back specialist ( I dont have them to review) there may be a bck up of months to see  rheumatology

## 2022-01-18 ENCOUNTER — Telehealth: Payer: 59 | Admitting: Internal Medicine

## 2022-01-26 ENCOUNTER — Encounter: Payer: Self-pay | Admitting: Family Medicine

## 2022-01-26 ENCOUNTER — Ambulatory Visit (INDEPENDENT_AMBULATORY_CARE_PROVIDER_SITE_OTHER): Payer: 59 | Admitting: Family Medicine

## 2022-01-26 VITALS — BP 112/68 | HR 77 | Temp 98.2°F | Wt 120.6 lb

## 2022-01-26 DIAGNOSIS — L03012 Cellulitis of left finger: Secondary | ICD-10-CM | POA: Diagnosis not present

## 2022-01-26 MED ORDER — AMOXICILLIN-POT CLAVULANATE 500-125 MG PO TABS: 1.0000 | ORAL_TABLET | Freq: Two times a day (BID) | ORAL | 0 refills | Status: AC

## 2022-01-26 NOTE — Progress Notes (Signed)
Subjective:    Patient ID: Morgan Snyder, female    DOB: May 20, 2000, 21 y.o.   MRN: [id number]  Chief Complaint  Patient presents with   Edema    Pt reports redness and swelling on ring finger of L hand. She states she thinks it is from a hang nail that was pulled out about 2 wks ago. Been cleaning it with chloride peroxide. tried neosporin.  Taking ibuprofen.     HPI Patient is a 21 year old who is followed by Dr. Fabian Sharp and seen today for acute concern.  Pt with edema, redness, and pain of L 4th digit of hand after pulling a hangnail x 2 wks ago.  Pt tried cleaning area with peroxide and using Neosporin.  States skin becomes dry and irritated.  Denies fever, chills, nausea, vomiting.  Pt inquires about a prescription for menstrual cramps.  Seen by OB/Gyn. Past Medical History:  Diagnosis Date   Seasonal allergies     No Known Allergies  ROS General: Denies fever, chills, night sweats, changes in weight, changes in appetite HEENT: Denies headaches, ear pain, changes in vision, rhinorrhea, sore throat CV: Denies CP, palpitations, SOB, orthopnea Pulm: Denies SOB, cough, wheezing GI: Denies abdominal pain, nausea, vomiting, diarrhea, constipation GU: Denies dysuria, hematuria, frequency, vaginal discharge Msk: Denies muscle cramps, joint pains Neuro: Denies weakness, numbness, tingling Skin: Denies rashes, bruising  +edema, redness, and pain of finger Psych: Denies depression, anxiety, hallucinations     Objective:    Blood pressure 112/68, pulse 77, temperature 98.2 F (36.8 C), temperature source Oral, weight 120 lb 9.6 oz (54.7 kg), SpO2 97 %.  Gen. Pleasant, well-nourished, in no distress, normal affect   HEENT: /AT, face symmetric, conjunctiva clear, no scleral icterus, PERRLA, EOMI, nares patent without drainage Lungs: no accessory muscle use, CTAB, no wheezes or rales Cardiovascular: RRR, no m/r/g, no peripheral edema Neuro:  A&Ox3, CN II-XII intact, normal  gait Skin:  Warm, dry, intact.  Left fourth digit with erythema and edema surrounding base of fingernail.  Dried debris noted underneath edge of nail.  No drainage expressed. TTP.   Wt Readings from Last 3 Encounters:  01/26/22 120 lb 9.6 oz (54.7 kg)  01/09/22 118 lb (53.5 kg)  11/18/21 116 lb (52.6 kg)    Lab Results  Component Value Date   WBC 4.7 01/09/2022   HGB 13.8 01/09/2022   HCT 41.0 01/09/2022   PLT 242.0 01/09/2022   GLUCOSE 84 01/09/2022   CHOL 115 11/25/2014   TRIG 69.0 11/25/2014   HDL 47.10 11/25/2014   LDLCALC 54 11/25/2014   ALT 12 01/09/2022   AST 16 01/09/2022   NA 135 01/09/2022   K 3.5 01/09/2022   CL 100 01/09/2022   CREATININE 0.65 01/09/2022   BUN 7 01/09/2022   CO2 29 01/09/2022   TSH 1.21 11/25/2014    Assessment/Plan:  Paronychia of finger, left - Plan: amoxicillin-clavulanate (AUGMENTIN) 500-125 MG tablet  Start antibiotic.  Epsom salt soaks as needed.  Tylenol or ibuprofen for pain/discomfort.  Monitor for continued s/s of worsening infection.  Given handout.  Follow-up with OB/Gyn or PCP regarding menses  F/u as needed  Abbe Amsterdam, MD

## 2022-01-27 ENCOUNTER — Telehealth: Payer: 59 | Admitting: Adult Health

## 2022-02-16 DIAGNOSIS — L84 Corns and callosities: Secondary | ICD-10-CM | POA: Diagnosis not present

## 2022-02-16 DIAGNOSIS — D224 Melanocytic nevi of scalp and neck: Secondary | ICD-10-CM | POA: Diagnosis not present

## 2022-03-22 ENCOUNTER — Other Ambulatory Visit: Payer: Self-pay

## 2022-03-22 ENCOUNTER — Ambulatory Visit (HOSPITAL_COMMUNITY)
Admission: RE | Admit: 2022-03-22 | Discharge: 2022-03-22 | Disposition: A | Discharge status: Home / Self Care | Payer: 59 | Source: Ambulatory Visit | Attending: Physician Assistant | Admitting: Physician Assistant

## 2022-03-22 ENCOUNTER — Telehealth: Payer: 59 | Admitting: Nurse Practitioner

## 2022-03-22 ENCOUNTER — Encounter (HOSPITAL_COMMUNITY): Payer: Self-pay

## 2022-03-22 VITALS — BP 99/66 | HR 88 | Temp 98.4°F | Resp 18

## 2022-03-22 DIAGNOSIS — R509 Fever, unspecified: Secondary | ICD-10-CM

## 2022-03-22 DIAGNOSIS — R051 Acute cough: Secondary | ICD-10-CM

## 2022-03-22 DIAGNOSIS — J069 Acute upper respiratory infection, unspecified: Secondary | ICD-10-CM

## 2022-03-22 DIAGNOSIS — J101 Influenza due to other identified influenza virus with other respiratory manifestations: Secondary | ICD-10-CM | POA: Diagnosis not present

## 2022-03-22 LAB — POC INFLUENZA A AND B ANTIGEN (URGENT CARE ONLY)
INFLUENZA A ANTIGEN, POC: POSITIVE — AB
INFLUENZA B ANTIGEN, POC: NEGATIVE

## 2022-03-22 MED ORDER — FLUTICASONE PROPIONATE 50 MCG/ACT NA SUSP: 2.0000 | Freq: Every day | NASAL | 6 refills | Status: DC

## 2022-03-22 MED ORDER — BENZONATATE 100 MG PO CAPS: 100.0000 mg | ORAL_CAPSULE | Freq: Three times a day (TID) | ORAL | 0 refills | Status: DC | PRN

## 2022-03-22 MED ORDER — OSELTAMIVIR PHOSPHATE 75 MG PO CAPS: 75.0000 mg | ORAL_CAPSULE | Freq: Two times a day (BID) | ORAL | 0 refills | Status: DC

## 2022-03-22 MED ORDER — PROMETHAZINE-DM 6.25-15 MG/5ML PO SYRP: 5.0000 mL | ORAL_SOLUTION | Freq: Three times a day (TID) | ORAL | 0 refills | Status: DC | PRN

## 2022-03-22 NOTE — Discharge Instructions (Signed)
You tested positive for influenza.  Please start Tamiflu twice daily for 5 days.  Make sure you rest and drink plenty of fluid.  Alternate Tylenol ibuprofen for fever.  Use Promethazine DM for cough.  This will make you sleepy so do not drive or drink alcohol while taking it.  If your symptoms are not improving by next week please return for reevaluation.  If you have any worsening symptoms including high fever not responding to medication, chest pain, shortness of breath, worsening cough, nausea/vomiting interfering with oral intake, weakness you need to be seen immediately.

## 2022-03-22 NOTE — Progress Notes (Signed)
E-Visit for Upper Respiratory Infection   We are sorry you are not feeling well.  Here is how we plan to help!  Based on what you have shared with me, it looks like you may have a viral upper respiratory infection.  Upper respiratory infections are caused by a large number of viruses; however, rhinovirus is the most common cause.  If you take a COVID test and test positive please schedule a follow up  Symptoms vary from person to person, with common symptoms including sore throat, cough, fatigue or lack of energy and feeling of general discomfort.  A low-grade fever of up to 100.4 may present, but is often uncommon.  Symptoms vary however, and are closely related to a person's age or underlying illnesses.  The most common symptoms associated with an upper respiratory infection are nasal discharge or congestion, cough, sneezing, headache and pressure in the ears and face.  These symptoms usually persist for about 3 to 10 days, but can last up to 2 weeks.  It is important to know that upper respiratory infections do not cause serious illness or complications in most cases.    Upper respiratory infections can be transmitted from person to person, with the most common method of transmission being a person's hands.  The virus is able to live on the skin and can infect other persons for up to 2 hours after direct contact.  Also, these can be transmitted when someone coughs or sneezes; thus, it is important to cover the mouth to reduce this risk.  To keep the spread of the illness at [place], good hand hygiene is very important.  This is an infection that is most likely caused by a virus. There are no specific treatments other than to help you with the symptoms until the infection runs its course.  We are sorry you are not feeling well.  Here is how we plan to help!   For nasal congestion, you may use an oral decongestants such as Mucinex D or if you have glaucoma or high blood pressure use plain Mucinex.  Saline  nasal spray or nasal drops can help and can safely be used as often as needed for congestion.  For your congestion, I have prescribed Fluticasone nasal spray one spray in each nostril twice a day  If you do not have a history of heart disease, hypertension, diabetes or thyroid disease, prostate/bladder issues or glaucoma, you may also use Sudafed to treat nasal congestion.  It is highly recommended that you consult with a pharmacist or your primary care physician to ensure this medication is safe for you to take.     If you have a cough, you may use cough suppressants such as Delsym and Robitussin.  If you have glaucoma or high blood pressure, you can also use Coricidin HBP.   For cough I have prescribed for you A prescription cough medication called Tessalon Perles 100 mg. You may take 1-2 capsules every 8 hours as needed for cough  If you have a sore or scratchy throat, use a saltwater gargle-  to  teaspoon of salt dissolved in a 4-ounce to 8-ounce glass of warm water.  Gargle the solution for approximately 15-30 seconds and then spit.  It is important not to swallow the solution.  You can also use throat lozenges/cough drops and Chloraseptic spray to help with throat pain or discomfort.  Warm or cold liquids can also be helpful in relieving throat pain.  For headache, pain or general  discomfort, you can use Ibuprofen or Tylenol as directed.   Some authorities believe that zinc sprays or the use of Echinacea may shorten the course of your symptoms.   HOME CARE Only take medications as instructed by your medical team. Be sure to drink plenty of fluids. Water is fine as well as fruit juices, sodas and electrolyte beverages. You may want to stay away from caffeine or alcohol. If you are nauseated, try taking small sips of liquids. How do you know if you are getting enough fluid? Your urine should be a pale yellow or almost colorless. Get rest. Taking a steamy shower or using a humidifier may help  nasal congestion and ease sore throat pain. You can place a towel over your head and breathe in the steam from hot water coming from a faucet. Using a saline nasal spray works much the same way. Cough drops, hard candies and sore throat lozenges may ease your cough. Avoid close contacts especially the very young and the elderly Cover your mouth if you cough or sneeze Always remember to wash your hands.   GET HELP RIGHT AWAY IF: You develop worsening fever. If your symptoms do not improve within 10 days You develop yellow or green discharge from your nose over 3 days. You have coughing fits You develop a severe head ache or visual changes. You develop shortness of breath, difficulty breathing or start having chest pain Your symptoms persist after you have completed your treatment plan  MAKE SURE YOU  Understand these instructions. Will watch your condition. Will get help right away if you are not doing well or get worse.  Thank you for choosing an e-visit.  Your e-visit answers were reviewed by a board certified advanced clinical practitioner to complete your personal care plan. Depending upon the condition, your plan could have included both over the counter or prescription medications.  Please review your pharmacy choice. Make sure the pharmacy is open so you can pick up prescription now. If there is a problem, you may contact your provider through [employer] and have the prescription routed to another pharmacy.  Your safety is important to [place]. If you have drug allergies check your prescription carefully.   For the next 24 hours you can use MyChart to ask questions about today's visit, request a non-urgent call back, or ask for a work or school excuse. You will get an email in the next two days asking about your experience. I hope that your e-visit has been valuable and will speed your recovery.  Meds ordered this encounter  Medications   benzonatate (TESSALON) 100 MG capsule     Sig: Take 1 capsule (100 mg total) by mouth 3 (three) times daily as needed.    Dispense:  30 capsule    Refill:  0   fluticasone (FLONASE) 50 MCG/ACT nasal spray    Sig: Place 2 sprays into both nostrils daily.    Dispense:  16 g    Refill:  6    I spent approximately 5 minutes reviewing the patient's history, current symptoms and coordinating their care today.

## 2022-03-22 NOTE — ED Triage Notes (Signed)
Complains of body aches and reports having a fever of 103.7, and sore throat.  Symptoms started Monday, 03/20/2022   Patient reports taking tylenol and theraflu

## 2022-03-22 NOTE — ED Provider Notes (Signed)
[place]    CSN: [id number] Arrival date & time: 03/22/22  1650      History   Chief Complaint Chief Complaint  Patient presents with   Fever    5:00   Appointment    5:00    HPI Morgan Snyder is a 22 y.o. female.   Patient presents today with a 2-day history of URI symptoms including body aches, fever, sore throat, cough, congestion.  Denies any nausea, vomiting, diarrhea.  Denies any known sick contacts.  She has not had influenza vaccine.  She has not had COVID-19 vaccines.  She has had COVID several years ago.  She has been taking Tylenol, ibuprofen, TheraFlu with minimal improvement of symptoms.  Denies any recent antibiotics or steroids.  She is confident that she is not pregnant.  Denies any significant past medical history including allergies, asthma, COPD, smoking.    Past Medical History:  Diagnosis Date   Seasonal allergies     Patient Active Problem List   Diagnosis Date Noted   History of UTI 07/18/2017    Past Surgical History:  Procedure Laterality Date   NO PAST SURGERIES      OB History   No obstetric history on file.      Home Medications    Prior to Admission medications   Medication Sig Start Date End Date Taking? Authorizing Provider  oseltamivir (TAMIFLU) 75 MG capsule Take 1 capsule (75 mg total) by mouth every 12 (twelve) hours. 03/22/22  Yes Avryl Roehm K, PA-C  promethazine-dextromethorphan (PROMETHAZINE-DM) 6.25-15 MG/5ML syrup Take 5 mLs by mouth 3 (three) times daily as needed for cough. 03/22/22  Yes June Rode K, PA-C  Calcium Acetate, Phos Binder, (CALCIUM ACETATE PO) Take by mouth.    [provider]  ferrous sulfate 325 (65 FE) MG tablet Take 325 mg by mouth every other day.    [provider]  fluticasone (FLONASE) 50 MCG/ACT nasal spray Place 2 sprays into both nostrils daily. Patient not taking: Reported on 03/22/2022 03/22/22   Apolonio Schneiders, FNP  MAGNESIUM OXIDE PO Take by mouth.     [provider]  TURMERIC PO Take 1 tablet by mouth daily.    [provider]    Family History Family History  Problem Relation Age of Onset   Anemia Mother    Hypertension Other    Deafness Other     Social History Social History   Tobacco Use   Smoking status: Never    Passive exposure: Yes   Smokeless tobacco: Never  Vaping Use   Vaping Use: Every day  Substance Use Topics   Alcohol use: Yes    Alcohol/week: 1.0 standard drink of alcohol    Types: 1 Glasses of wine per week    Comment: Occassionally   Drug use: Not Currently    Types: Marijuana     Allergies   Patient has no known allergies.   Review of Systems Review of Systems  Constitutional:  Positive for activity change, chills, fatigue and fever. Negative for appetite change.  HENT:  Positive for congestion and sore throat. Negative for sinus pressure and sneezing.   Respiratory:  Positive for cough. Negative for shortness of breath.   Cardiovascular:  Negative for chest pain.  Gastrointestinal:  Negative for abdominal pain, diarrhea, nausea and vomiting.  Musculoskeletal:  Positive for arthralgias and myalgias.  Neurological:  Positive for headaches. Negative for dizziness and light-headedness.     Physical Exam Triage Vital Signs  ED Triage Vitals  Enc Vitals Group     BP 03/22/22 1709 99/66     Pulse Rate 03/22/22 1709 88     Resp 03/22/22 1709 18     Temp 03/22/22 1709 98.4 F (36.9 C)     Temp src --      SpO2 03/22/22 1709 98 %     Weight --      Height --      Head Circumference --      Peak Flow --      Pain Score 03/22/22 1706 8     Pain Loc --      Pain Edu? --      Excl. in [place]? --    No data found.  Updated Vital Signs BP 99/66 (BP Location: Left Arm)   Pulse 88   Temp 98.4 F (36.9 C)   Resp 18   LMP 03/12/2022   SpO2 98%   Visual Acuity Right Eye Distance:   Left Eye Distance:   Bilateral Distance:    Right Eye Near:   Left Eye Near:     Bilateral Near:     Physical Exam Vitals reviewed.  Constitutional:      General: She is awake. She is not in acute distress.    Appearance: Normal appearance. She is well-developed. She is not ill-appearing.     Comments: Very pleasant female appears stated age in no acute distress sitting comfortably in exam room  HENT:     Head: Normocephalic and atraumatic.     Right Ear: Tympanic membrane, ear canal and external ear normal. Tympanic membrane is not erythematous or bulging.     Left Ear: Tympanic membrane, ear canal and external ear normal. Tympanic membrane is not erythematous or bulging.     Nose:     Right Sinus: No maxillary sinus tenderness or frontal sinus tenderness.     Left Sinus: No maxillary sinus tenderness or frontal sinus tenderness.     Mouth/Throat:     Pharynx: Uvula midline. No oropharyngeal exudate or posterior oropharyngeal erythema.     Tonsils: No tonsillar exudate or tonsillar abscesses. 1+ on the right. 1+ on the left.  Cardiovascular:     Rate and Rhythm: Normal rate and regular rhythm.     Heart sounds: Normal heart sounds, S1 normal and S2 normal. No murmur heard. Pulmonary:     Effort: Pulmonary effort is normal.     Breath sounds: Normal breath sounds. No wheezing, rhonchi or rales.     Comments: Clear to auscultation bilaterally Psychiatric:        Behavior: Behavior is cooperative.      UC Treatments / Results  Labs (all labs ordered are listed, but only abnormal results are displayed) Labs Reviewed  POC INFLUENZA A AND B ANTIGEN (URGENT CARE ONLY) - Abnormal; Notable for the following components:      Result Value   INFLUENZA A ANTIGEN, POC POSITIVE (*)    All other components within normal limits    EKG   Radiology No results found.  Procedures Procedures (including critical care time)  Medications Ordered in UC Medications - No data to display  Initial Impression / Assessment and Plan / UC Course  I have reviewed the triage  vital signs and the nursing notes.  Pertinent labs & imaging results that were available during my care of the patient were reviewed by me and considered in my medical decision making (see chart for details).  Patient is well-appearing, afebrile, nontoxic, nontachycardic here today.  She did test positive for influenza A.  Will start Tamiflu as she is within the window of effectiveness for this medication.  Discussed that if he has any significant GI side effects she should stop the medication.  Discussed conservative treatment measures including alternating Tylenol ibuprofen on a scheduled basis to manage fever.  She can use TheraFlu as needed.  She was prescribed Promethazine DM for cough.  Discussed that this can be sedating and she should not drive or drink alcohol with taking it.  She is to rest and drink plenty of fluid.  Discussed that if her symptoms or not improving by next week she should return for reevaluation.  If she has any worsening symptoms including high fever not responding to medication, chest pain, shortness of breath, nausea/vomiting interfrontal intake she needs to be seen immediately.  Strict return precautions given.  Work excuse note provided.  Final Clinical Impressions(s) / UC Diagnoses   Final diagnoses:  Influenza A  Fever, unspecified  Acute cough     Discharge Instructions      You tested positive for influenza.  Please start Tamiflu twice daily for 5 days.  Make sure you rest and drink plenty of fluid.  Alternate Tylenol ibuprofen for fever.  Use Promethazine DM for cough.  This will make you sleepy so do not drive or drink alcohol while taking it.  If your symptoms are not improving by next week please return for reevaluation.  If you have any worsening symptoms including high fever not responding to medication, chest pain, shortness of breath, worsening cough, nausea/vomiting interfering with oral intake, weakness you need to be seen immediately.     ED  Prescriptions     Medication Sig Dispense Auth. Provider   promethazine-dextromethorphan (PROMETHAZINE-DM) 6.25-15 MG/5ML syrup Take 5 mLs by mouth 3 (three) times daily as needed for cough. 118 mL Stephaney Steven K, PA-C   oseltamivir (TAMIFLU) 75 MG capsule Take 1 capsule (75 mg total) by mouth every 12 (twelve) hours. 10 capsule Brittan Butterbaugh, Derry Skill, PA-C      PDMP not reviewed this encounter.   Terrilee Croak, PA-C 03/22/22 1821

## 2022-06-13 DIAGNOSIS — Z6821 Body mass index (BMI) 21.0-21.9, adult: Secondary | ICD-10-CM | POA: Diagnosis not present

## 2022-06-13 DIAGNOSIS — N946 Dysmenorrhea, unspecified: Secondary | ICD-10-CM | POA: Diagnosis not present

## 2022-06-13 DIAGNOSIS — Z01419 Encounter for gynecological examination (general) (routine) without abnormal findings: Secondary | ICD-10-CM | POA: Diagnosis not present

## 2022-08-08 NOTE — Progress Notes (Signed)
Morgan Snyder is a 22 y.o. female here for a new problem.  History of Present Illness:   No chief complaint on file.   HPI  Neck Pain Has been having right-sided neck pain.  Sleep has been worse due to this.   Past Medical History:  Diagnosis Date   Seasonal allergies      Social History   Tobacco Use   Smoking status: Never    Passive exposure: Yes   Smokeless tobacco: Never  Vaping Use   Vaping Use: Every day  Substance Use Topics   Alcohol use: Yes    Alcohol/week: 1.0 standard drink of alcohol    Types: 1 Glasses of wine per week    Comment: Occassionally   Drug use: Not Currently    Types: Marijuana    Past Surgical History:  Procedure Laterality Date   NO PAST SURGERIES      Family History  Problem Relation Age of Onset   Anemia Mother    Hypertension Other    Deafness Other     No Known Allergies  Current Medications:   Current Outpatient Medications:    Calcium Acetate, Phos Binder, (CALCIUM ACETATE PO), Take by mouth., Disp: , Rfl:    ferrous sulfate 325 (65 FE) MG tablet, Take 325 mg by mouth every other day., Disp: , Rfl:    fluticasone (FLONASE) 50 MCG/ACT nasal spray, Place 2 sprays into both nostrils daily. (Patient not taking: Reported on 03/22/2022), Disp: 16 g, Rfl: 6   MAGNESIUM OXIDE PO, Take by mouth., Disp: , Rfl:    oseltamivir (TAMIFLU) 75 MG capsule, Take 1 capsule (75 mg total) by mouth every 12 (twelve) hours., Disp: 10 capsule, Rfl: 0   promethazine-dextromethorphan (PROMETHAZINE-DM) 6.25-15 MG/5ML syrup, Take 5 mLs by mouth 3 (three) times daily as needed for cough., Disp: 118 mL, Rfl: 0   TURMERIC PO, Take 1 tablet by mouth daily., Disp: , Rfl:    Review of Systems:   ROS  Vitals:   There were no vitals filed for this visit.   There is no height or weight on file to calculate BMI.  Physical Exam:   Physical Exam  Assessment and Plan:   ***   I,Alexander Ruley,acting as a scribe for Jarold Motto,  PA.,have documented all relevant documentation on the behalf of Jarold Motto, PA,as directed by  Jarold Motto, PA while in the presence of Jarold Motto, [place].   ***   Jarold Motto, PA-C

## 2022-08-09 ENCOUNTER — Encounter: Payer: Self-pay | Admitting: Family Medicine

## 2022-08-09 ENCOUNTER — Ambulatory Visit (INDEPENDENT_AMBULATORY_CARE_PROVIDER_SITE_OTHER): Payer: 59 | Admitting: Family Medicine

## 2022-08-09 ENCOUNTER — Ambulatory Visit (INDEPENDENT_AMBULATORY_CARE_PROVIDER_SITE_OTHER): Payer: 59

## 2022-08-09 VITALS — BP 118/62 | HR 100 | Temp 99.1°F | Wt 126.8 lb

## 2022-08-09 DIAGNOSIS — R42 Dizziness and giddiness: Secondary | ICD-10-CM

## 2022-08-09 DIAGNOSIS — L509 Urticaria, unspecified: Secondary | ICD-10-CM

## 2022-08-09 DIAGNOSIS — F439 Reaction to severe stress, unspecified: Secondary | ICD-10-CM

## 2022-08-09 DIAGNOSIS — G8929 Other chronic pain: Secondary | ICD-10-CM

## 2022-08-09 DIAGNOSIS — R519 Headache, unspecified: Secondary | ICD-10-CM

## 2022-08-09 DIAGNOSIS — F419 Anxiety disorder, unspecified: Secondary | ICD-10-CM

## 2022-08-09 DIAGNOSIS — G4700 Insomnia, unspecified: Secondary | ICD-10-CM | POA: Diagnosis not present

## 2022-08-09 DIAGNOSIS — M542 Cervicalgia: Secondary | ICD-10-CM
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
## 2022-08-09 NOTE — Patient Instructions (Signed)
We will obtain an x-ray of the neck this visit.  See if you need the results of prior imaging.  Based on the x-ray results physical therapy and referral to Ortho likely needed.  You can take ibuprofen as needed for the discomfort.  You can also use heat and topical medications such as Biofreeze, Aspercreme, IcyHot.  Work on your stress by doing things for you and setting boundaries.  You should make an appointment to have your vision checked.

## 2022-12-11 ENCOUNTER — Other Ambulatory Visit: Payer: Self-pay | Admitting: Family

## 2022-12-11 DIAGNOSIS — R5383 Other fatigue: Secondary | ICD-10-CM | POA: Diagnosis not present

## 2022-12-11 DIAGNOSIS — F419 Anxiety disorder, unspecified: Secondary | ICD-10-CM | POA: Diagnosis not present

## 2022-12-11 DIAGNOSIS — R351 Nocturia: Secondary | ICD-10-CM | POA: Diagnosis not present

## 2022-12-11 DIAGNOSIS — N6310 Unspecified lump in the right breast, unspecified quadrant: Secondary | ICD-10-CM

## 2022-12-13 ENCOUNTER — Ambulatory Visit
Admission: RE | Admit: 2022-12-13 | Discharge: 2022-12-13 | Disposition: A | Discharge status: Home / Self Care | Payer: 59 | Source: Ambulatory Visit | Attending: Family | Admitting: Family

## 2022-12-13 DIAGNOSIS — N6310 Unspecified lump in the right breast, unspecified quadrant: Secondary | ICD-10-CM

## 2022-12-13 DIAGNOSIS — N6489 Other specified disorders of breast: Secondary | ICD-10-CM | POA: Diagnosis not present

## 2022-12-13 DIAGNOSIS — R92341 Mammographic extreme density, right breast: Secondary | ICD-10-CM | POA: Diagnosis not present

## 2022-12-26 ENCOUNTER — Other Ambulatory Visit: Payer: 59

## 2023-02-06 ENCOUNTER — Ambulatory Visit (INDEPENDENT_AMBULATORY_CARE_PROVIDER_SITE_OTHER): Payer: 59 | Admitting: Physician Assistant

## 2023-02-06 ENCOUNTER — Encounter: Payer: Self-pay | Admitting: Physician Assistant

## 2023-02-06 VITALS — BP 102/68 | HR 95 | Temp 98.0°F | Ht 65.0 in | Wt 127.0 lb

## 2023-02-06 DIAGNOSIS — R52 Pain, unspecified: Secondary | ICD-10-CM | POA: Diagnosis not present

## 2023-02-06 DIAGNOSIS — D649 Anemia, unspecified: Secondary | ICD-10-CM | POA: Insufficient documentation

## 2023-02-06 DIAGNOSIS — U071 COVID-19: Secondary | ICD-10-CM | POA: Diagnosis not present

## 2023-02-06 DIAGNOSIS — R051 Acute cough: Secondary | ICD-10-CM | POA: Diagnosis not present

## 2023-02-06 LAB — POCT INFLUENZA A/B
Influenza A, POC: NEGATIVE
Influenza B, POC: NEGATIVE

## 2023-02-06 LAB — POC COVID19 BINAXNOW: SARS Coronavirus 2 Ag: POSITIVE — AB

## 2023-02-06 NOTE — Progress Notes (Signed)
Patient ID: Morgan Snyder, female    DOB: 03/12/2000, 22 y.o.   MRN: [id number]   Assessment & Plan:  COVID-19  Acute cough -     POC COVID-19 BinaxNow -     POCT Influenza A/B  Generalized body aches -     POC COVID-19 BinaxNow -     POCT Influenza A/B   Assessment and Plan    COVID-19 Positive test with symptoms of congestion, fatigue, and headache. No chest pain or shortness of breath. -Advise rest, hydration, and vitamin intake. -Return to work on 02/12/2023. -Seek urgent care if symptoms worsen or chest pain/shortness of breath develop.  Epistaxis Recent nosebleed with subsequent bloody drainage. No active bleeding on examination. -Advise to monitor and seek care if bleeding persists or worsens.           Return if symptoms worsen or fail to improve.    Subjective:    Chief Complaint  Patient presents with   Cough    Pt in office for cough,congestion, fever, and body aches for more than 1 week; pt has taken tylenol today for headache; drainage with blood, bloody nose that started the weekend; took two at home Covid and both positive with faint lines;     HPI Discussed the use of AI scribe software for clinical note transcription with the patient, who gave verbal consent to proceed.  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 She reports that her boyfriend was ill two weeks ago, but tested negative for COVID-19. Last week, she experienced extreme fatigue and lack of motivation, followed by severe congestion on Thursday. However, she felt better by Friday and resumed normal activities over the weekend. Yesterday, she began to feel ill again, with symptoms of congestion and a feverish feeling. This morning, she woke up with a headache. She took two COVID-19 tests last night, both of which showed a faint positive line. She also reports a nosebleed over the weekend, and since then,  she has had bloody drainage from her nose that she coughs up. She also experienced chest tightness over the weekend, which has since resolved.       Past Medical History:  Diagnosis Date   Chest pain on exertion 11/19/2012   Seasonal allergies     Past Surgical History:  Procedure Laterality Date   NO PAST SURGERIES      Family History  Problem Relation Age of Onset   Anemia Mother    Hypertension Other    Deafness Other     Social History   Tobacco Use   Smoking status: Never    Passive exposure: Yes   Smokeless tobacco: Never  Vaping Use   Vaping status: Every Day  Substance Use Topics   Alcohol use: Yes    Alcohol/week: 1.0 standard drink of alcohol    Types: 1 Glasses of wine per week    Comment: Occassionally   Drug use: Not Currently    Types: Marijuana     No Known Allergies  Review of Systems NEGATIVE UNLESS OTHERWISE INDICATED IN HPI      Objective:     BP 102/68 (BP Location: Left Arm, Patient Position: Sitting, Cuff Size: Normal)   Pulse 95   Temp 98 F (36.7 C) (Temporal)   Ht 5\' 5"  (1.651 m)   LMP 01/23/2023 (Exact Date)   SpO2 98%   BMI 21.10 kg/m   Wt Readings from  Last 3 Encounters:  08/09/22 126 lb 12.8 oz (57.5 kg)  01/26/22 120 lb 9.6 oz (54.7 kg)  01/09/22 118 lb (53.5 kg)    BP Readings from Last 3 Encounters:  02/06/23 102/68  08/09/22 118/62  03/22/22 99/66     Physical Exam Vitals and nursing note reviewed.  Constitutional:      General: She is not in acute distress.    Appearance: Normal appearance. She is not ill-appearing.  HENT:     Head: Normocephalic.     Right Ear: Tympanic membrane, ear canal and external ear normal.     Left Ear: Tympanic membrane, ear canal and external ear normal.     Nose: Congestion present.     Mouth/Throat:     Mouth: Mucous membranes are moist.     Pharynx: No oropharyngeal exudate or posterior oropharyngeal erythema.  Eyes:     Extraocular Movements: Extraocular movements  intact.     Conjunctiva/sclera: Conjunctivae normal.     Pupils: Pupils are equal, round, and reactive to light.  Cardiovascular:     Rate and Rhythm: Normal rate and regular rhythm.     Pulses: Normal pulses.     Heart sounds: Normal heart sounds. No murmur heard. Pulmonary:     Effort: Pulmonary effort is normal. No respiratory distress.     Breath sounds: Normal breath sounds. No wheezing.  Musculoskeletal:     Cervical back: Normal range of motion.  Skin:    General: Skin is warm.  Neurological:     Mental Status: She is alert and oriented to person, place, and time.  Psychiatric:        Mood and Affect: Mood normal.        Behavior: Behavior normal.          Keiasha Diep M Elody Kleinsasser, PA-C

## 2023-06-27 ENCOUNTER — Other Ambulatory Visit: Payer: Self-pay | Admitting: Obstetrics and Gynecology

## 2023-06-27 DIAGNOSIS — Z01419 Encounter for gynecological examination (general) (routine) without abnormal findings: Secondary | ICD-10-CM | POA: Diagnosis not present

## 2023-06-27 DIAGNOSIS — Z124 Encounter for screening for malignant neoplasm of cervix: Secondary | ICD-10-CM | POA: Diagnosis not present

## 2023-06-27 DIAGNOSIS — N6341 Unspecified lump in right breast, subareolar: Secondary | ICD-10-CM

## 2023-06-27 DIAGNOSIS — Z6822 Body mass index (BMI) 22.0-22.9, adult: Secondary | ICD-10-CM | POA: Diagnosis not present

## 2023-07-18 ENCOUNTER — Ambulatory Visit
Admission: RE | Admit: 2023-07-18 | Discharge: 2023-07-18 | Disposition: A | Discharge status: Home / Self Care | Payer: Self-pay | Source: Ambulatory Visit | Attending: Obstetrics and Gynecology | Admitting: Obstetrics and Gynecology

## 2023-07-18 DIAGNOSIS — N6341 Unspecified lump in right breast, subareolar: Secondary | ICD-10-CM

## 2023-07-18 DIAGNOSIS — N6311 Unspecified lump in the right breast, upper outer quadrant: Secondary | ICD-10-CM | POA: Diagnosis not present

## 2023-07-18 DIAGNOSIS — N6313 Unspecified lump in the right breast, lower outer quadrant: Secondary | ICD-10-CM | POA: Diagnosis not present

## 2023-08-01 ENCOUNTER — Telehealth: Admitting: Physician Assistant

## 2023-08-01 DIAGNOSIS — H109 Unspecified conjunctivitis: Secondary | ICD-10-CM | POA: Diagnosis not present

## 2023-08-01 MED ORDER — OFLOXACIN 0.3 % OP SOLN: 1.0000 [drp] | Freq: Four times a day (QID) | OPHTHALMIC | 0 refills | Status: AC

## 2023-08-01 NOTE — Progress Notes (Signed)

## 2023-11-09 ENCOUNTER — Telehealth: Admitting: Physician Assistant

## 2023-11-09 DIAGNOSIS — R21 Rash and other nonspecific skin eruption: Secondary | ICD-10-CM

## 2023-11-09 NOTE — Progress Notes (Signed)

## 2023-11-12 ENCOUNTER — Ambulatory Visit (INDEPENDENT_AMBULATORY_CARE_PROVIDER_SITE_OTHER): Admitting: Family Medicine

## 2023-11-12 ENCOUNTER — Encounter: Payer: Self-pay | Admitting: Family Medicine

## 2023-11-12 VITALS — BP 92/62 | HR 70 | Temp 97.8°F | Ht 65.0 in | Wt 131.3 lb

## 2023-11-12 DIAGNOSIS — L237 Allergic contact dermatitis due to plants, except food: Secondary | ICD-10-CM | POA: Diagnosis not present

## 2023-11-12 MED ORDER — CLOBETASOL PROPIONATE 0.05 % EX OINT: 1.0000 | TOPICAL_OINTMENT | Freq: Two times a day (BID) | CUTANEOUS | 0 refills | Status: AC

## 2023-11-12 NOTE — Progress Notes (Signed)
   Acute Office Visit  Subjective:     Patient ID: Morgan Snyder, female    DOB: 05-15-2000, 23 y.o.   MRN: [id number]  Chief Complaint  Patient presents with   Poison Ivy    Entire body x1 week, tried Calamine lotion and Benadryl with some relief    Pt states she was doing yardwork outside last week, states that she has patches on her arms, legs, stomach, and groin area. Has been using calalmine lotion and benadryl with mild improvement but it is still very itchy and red.    Poison Porter This is a new problem. The current episode started in the past 7 days. The problem has been gradually improving since onset. The rash is characterized by dryness, peeling, itchiness and blistering.     Review of Systems  All other systems reviewed and are negative.       Objective:    BP 92/62   Pulse 70   Temp 97.8 F (36.6 C) (Oral)   Ht 5' 5 (1.651 m)   Wt 131 lb 4.8 oz (59.6 kg)   LMP 11/08/2023   SpO2 98%   BMI 21.85 kg/m    Physical Exam Vitals reviewed.  Constitutional:      Appearance: Normal appearance. She is normal weight.  Neurological:     Mental Status: She is alert.     No results found for any visits on 11/12/23.      Assessment & Plan:   Problem List Items Addressed This Visit   None Visit Diagnoses       Allergic contact dermatitis due to plants, except food    -  Primary   Relevant Medications   clobetasol  ointment (TEMOVATE ) 0.05 %     Will treat with clobetasol  ointment, the distribution is not widespread enough for oral steroids. Reassured patient.   Meds ordered this encounter  Medications   clobetasol  ointment (TEMOVATE ) 0.05 %    Sig: Apply 1 Application topically 2 (two) times daily.    Dispense:  60 g    Refill:  0    No follow-ups on file.  Heron CHRISTELLA Sharper, MD

## 2023-11-30 ENCOUNTER — Ambulatory Visit: Payer: Self-pay

## 2023-11-30 NOTE — Telephone Encounter (Signed)
 FYI Only or Action Required?: FYI only for provider.  Patient was last seen in primary care on 11/12/2023 by Ozell Heron HERO, MD.  Called Nurse Triage reporting Back Pain.  Symptoms began several days ago.  Interventions attempted: Prescription medications: Unknown name, not home to verify.  Symptoms are: gradually worsening.  Triage Disposition: See PCP When Office is Open (Within 3 Days)  Patient/caregiver understands and will follow disposition?: Yes Reason for Disposition  [1] MODERATE back pain (e.g., interferes with normal activities) AND [2] present > 3 days  Answer Assessment - Initial Assessment Questions Reports mid/left atraumatic back pain. Scheduled OV Monday, advised may also go to UC if worsens tonight or over the weekend. Advised she can apply heat, position pillow between knees when sleeping and take NSAID (but not with the unknown rx she is taking in case it interacts or is an NSAID).   1. ONSET: When did the pain begin? (e.g., minutes, hours, days)     Tuesday 10/10  2. LOCATION: Where does it hurt? (upper, mid or lower back)     Middle/left  3. SEVERITY: How bad is the pain?  (e.g., Scale 1-10; mild, moderate, or severe)     5-7/10  4. PATTERN: Is the pain constant? (e.g., yes, no; constant, intermittent)      Depends on movement or deep breaths  5. BACK OVERUSE:  Any recent lifting of heavy objects, strenuous work or exercise?     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 MEDICINES: What have you taken so far for the pain? (e.g., nothing, acetaminophen, NSAIDS)     Unknown antiinflammatory, old medication she had  9. NEUROLOGIC SYMPTOMS: Do you have any weakness, numbness, or problems with bowel/bladder control?     Denies  Protocols used: Back Pain-A-AH Copied from CRM [id number]. Topic: Clinical - Red Word Triage >> Nov 30, 2023  9:33 AM Rosina BIRCH wrote: Reason for RMF:ajrx pain; when she breathes or moves it  hurts bad and started on tuesday

## 2023-12-03 ENCOUNTER — Ambulatory Visit: Admitting: Family Medicine

## 2023-12-03 ENCOUNTER — Encounter: Payer: Self-pay | Admitting: Family Medicine

## 2023-12-03 VITALS — BP 118/70 | HR 80 | Temp 98.0°F | Resp 16 | Ht 65.0 in | Wt 130.2 lb

## 2023-12-03 DIAGNOSIS — M5489 Other dorsalgia: Secondary | ICD-10-CM

## 2023-12-03 MED ORDER — METHOCARBAMOL 500 MG PO TABS
500.0000 mg | ORAL_TABLET | Freq: Three times a day (TID) | ORAL | 0 refills | Status: AC | PRN
Start: 2023-12-03 — End: 2023-12-13

## 2023-12-03 MED ORDER — CELECOXIB 100 MG PO CAPS: 100.0000 mg | ORAL_CAPSULE | Freq: Two times a day (BID) | ORAL | 0 refills | Status: AC

## 2023-12-03 NOTE — Patient Instructions (Addendum)
 A few things to remember from today's visit:  Interscapular pain - Plan: celecoxib (CELEBREX) 100 MG capsule, methocarbamol (ROBAXIN) 500 MG tablet It seems muscular pain. Local massage, icy hot or asper cream. Stop Aleve . Celebrex for 10 days and Methocarbamol at night mainly because it causes drowsiness.  Do not use My Chart to request refills or for acute issues that need immediate attention. If you send a my chart message, it may take a few days to be addressed, specially if I am not in the office.  Please be sure medication list is accurate. If a new problem present, please set up appointment sooner than planned today.

## 2023-12-03 NOTE — Progress Notes (Signed)
 ACUTE VISIT Chief Complaint  Patient presents with   Back Pain   Discussed the use of AI scribe software for clinical note transcription with the patient, who gave verbal consent to proceed.  History of Present Illness Morgan Snyder is a 23 year old female who presents with middle upper back pain.  She has been experiencing middle upper back pain on the left side for approximately six days. The pain is a constant dull ache that becomes sharp with certain movements, such as hiccuping or burping, and can be severe enough to cause her to fall to her knees. It worsens with deep breathing but is not associated with cough, wheezing, SOB,or skin changes.  Hx of lower and cervical pain but she denies any previous episodes of this specific pain.  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  She has been managing the pain with ice initially and then heat, as well as taking Aleve  Back and Muscle Pain.  The pain has been fluctuating in intensity, with bad days on Tuesday, Thursday, and Friday, and better days on Wednesday and yesterday.  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 She underwent a rheumatology workup in November 2023, which was negative and included  ANA, CCP, inflammatory markers, HLA B27, and rheumatoid arthritis.  Her mother has rheumatoid arthritis.  01/2019 Lumbar X ray done due to pain showed slight disc narrowing at L5-S1. 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 The disc spaces appear unremarkable. No fracture or spondylolisthesis.  Review of Systems  Constitutional:  Negative for activity change, appetite change, chills and fever.  HENT:  Negative for mouth sores, sore throat and  trouble swallowing.   Cardiovascular:  Negative for chest pain.  Gastrointestinal:  Negative for abdominal pain, nausea and vomiting.  Genitourinary:  Negative for decreased urine volume, dysuria and hematuria.  Skin:  Negative for rash.  Neurological:  Negative for weakness and numbness.  See other pertinent positives and negatives in HPI.  Current Outpatient Medications on File Prior to Visit  Medication Sig Dispense Refill   Calcium Acetate, Phos Binder, (CALCIUM ACETATE PO) Take by mouth.     clobetasol  ointment (TEMOVATE ) 0.05 % Apply 1 Application topically 2 (two) times daily. 60 g 0   ferrous sulfate 325 (65 FE) MG tablet Take 325 mg by mouth every other day.     TURMERIC PO Take 1 tablet by mouth daily.     MAGNESIUM OXIDE PO Take by mouth.     No current facility-administered medications on file prior to visit.   Past Medical History:  Diagnosis Date   Chest pain on exertion 11/19/2012   Seasonal allergies    No Known Allergies  Social History   Socioeconomic History   Marital status: Single    Spouse name: Not on file   Number of children: Not on file   Years of education: Not on file   Highest education level: Not on file  Occupational History   Occupation: Student  Tobacco Use   Smoking status: Never    Passive exposure: Yes   Smokeless tobacco: Never  Vaping Use   Vaping status: Every Day  Substance and Sexual Activity   Alcohol use: Yes  Alcohol/week: 1.0 standard drink of alcohol    Types: 1 Glasses of wine per week    Comment: Occassionally   Drug use: Not Currently    Types: Marijuana   Sexual activity: Yes    Birth control/protection: None  Other Topics Concern   Not on file  Social History Narrative      Lives with mom Carlina Derks and step dad    Mom office manager father Physicist, medical   Household 3 negative pets firearms positive ETS stepfather.    Social Drivers of Corporate investment banker Strain: Not on file  Food  Insecurity: Not on file  Transportation Needs: Not on file  Physical Activity: Not on file  Stress: Not on file  Social Connections: Not on file   Vitals:   12/03/23 0854  BP: 118/70  Pulse: 80  Resp: 16  Temp: 98 F (36.7 C)  SpO2: 98%   Body mass index is 21.67 kg/m.  Physical Exam Vitals and nursing note reviewed.  Constitutional:      General: She is not in acute distress.    Appearance: She is well-developed. She is not ill-appearing.  HENT:     Head: Normocephalic and atraumatic.     Mouth/Throat:     Mouth: Mucous membranes are moist.  Eyes:     Conjunctiva/sclera: Conjunctivae normal.  Cardiovascular:     Rate and Rhythm: Normal rate and regular rhythm.     Heart sounds: No murmur heard. Pulmonary:     Effort: Pulmonary effort is normal. No respiratory distress.     Breath sounds: Normal breath sounds.  Abdominal:     Palpations: Abdomen is soft. There is no hepatomegaly or mass.     Tenderness: There is no abdominal tenderness.  Musculoskeletal:     Cervical back: No tenderness or bony tenderness.     Thoracic back: Spasms present.       Back:     Comments: Left interscapular pain elicited with deep breathing and shoulders abduction. No pain with palpation. No deformity or skin changes appreciated.  Lymphadenopathy:     Cervical: No cervical adenopathy.  Skin:    General: Skin is warm.     Findings: No erythema or rash.  Neurological:     General: No focal deficit present.     Mental Status: She is alert and oriented to person, place, and time.     Gait: Gait normal.     Deep Tendon Reflexes:     Reflex Scores:      Patellar reflexes are 2+ on the right side and 2+ on the left side. Psychiatric:        Mood and Affect: Mood and affect normal.   ASSESSMENT AND PLAN:  Othel was seen today for left upper back pain.  Interscapular pain -     Celecoxib; Take 1 capsule (100 mg total) by mouth 2 (two) times daily for 10 days.  Dispense: 20 capsule;  Refill: 0 -     Methocarbamol; Take 1 tablet (500 mg total) by mouth every 8 (eight) hours as needed for up to 10 days for muscle spasms.  Dispense: 30 tablet; Refill: 0  We discussed possible etiologies. History and examination do not suggest a serious process, so I do not think imaging or further workup are needed at this time. It seems to be musculoskeletal. Recommend stopping OTC Aleve . She can try IcyHot or Aspercreme on affected area as well as local massage.  Chiropractic  treatment may also help. Celebrex 100 mg twice daily for 10 days and methocarbamol 500 mg 3 times daily as needed, the latter one recommend taking mainly at bedtime due to side effects. Monitor for new symptoms. If not resolved in 2 weeks, recommend follow-up with PCP.  She voices understanding and agrees with plan.  Return in about 3 weeks (around 12/24/2023), or if symptoms worsen or fail to improve, for upper back pain with PCP.  Oluwademilade Mckiver G. [place], MD  [facility]. [place] office.

## 2024-02-25 ENCOUNTER — Telehealth: Admitting: Physician Assistant

## 2024-02-25 DIAGNOSIS — L03039 Cellulitis of unspecified toe: Secondary | ICD-10-CM | POA: Diagnosis not present

## 2024-02-25 DIAGNOSIS — L600 Ingrowing nail: Secondary | ICD-10-CM

## 2024-02-25 MED ORDER — SULFAMETHOXAZOLE-TRIMETHOPRIM 800-160 MG PO TABS: 1.0000 | ORAL_TABLET | Freq: Two times a day (BID) | ORAL | 0 refills | Status: AC

## 2024-02-25 NOTE — Progress Notes (Signed)
 E Visit for Cellulitis  We are sorry that you are not feeling well. Here is how we plan to help!  Based on what you shared, it appears you may have paronychia, infection of the nail bed, most likely from ingrown nail.   I have prescribed:  Bactrim  DS -- Take 1 tablet by mouth twice a day for 7 days  HOME CARE:  Take all medications as prescribed and be sure to complete the full course - even if your skin appears to be improving.   GET HELP RIGHT AWAY IF:  Symptoms do not begin to improve within 48 hours. Severe redness persists or worsens. If the area turns color, spreads or swells. If it blisters and opens, develops yellow-brown crust or bleeds. You develop a fever or chills. If the pain increases or becomes unbearable.  Are unable to keep fluids and food down.  MAKE SURE YOU   Understand these instructions. Will watch your condition. Will get help right away if you are not doing well or get worse.  Thank you for choosing an e-visit.   Your e-visit answers were reviewed by a board certified advanced clinical practitioner to complete your personal care plan. Depending upon the condition, your plan could have included both over the counter or prescription medications.   Please review your pharmacy choice. Make sure the pharmacy is open so you can pick up the prescription now. If there is a problem, you may contact your provider through [employer] and have the prescription routed to another pharmacy.   Your safety is important to us . If you have drug allergies, check your prescription carefully.   For the next 24 hours you can use MyChart to ask questions about today's visit, request a non-urgent call back, or ask for a work or school excuse.   You will receive an email in the next two days asking about your experience. I hope that your e-visit has been valuable and will speed up your recovery.    I have spent 5 minutes in review of e-visit questionnaire, review and  updating patient chart, medical decision making and response to patient.   Delon CHRISTELLA Dickinson, PA-C
